# Patient Record
Sex: Female | Born: 1980 | Race: Black or African American | Hispanic: No | Marital: Married | Smoking: Never smoker
Health system: Southern US, Community
[De-identification: ages and names within clinical notes are randomized; demographics above are authoritative.]

## PROBLEM LIST (undated history)

## (undated) DIAGNOSIS — L309 Dermatitis, unspecified: Secondary | ICD-10-CM

## (undated) DIAGNOSIS — O24419 Gestational diabetes mellitus in pregnancy, unspecified control: Secondary | ICD-10-CM

## (undated) HISTORY — DX: Dermatitis, unspecified: L30.9

## (undated) HISTORY — DX: Gestational diabetes mellitus in pregnancy, unspecified control: O24.419

## (undated) HISTORY — PX: NO PAST SURGERIES: SHX2092

---

## 2016-04-08 ENCOUNTER — Ambulatory Visit
Admission: RE | Admit: 2016-04-08 | Discharge: 2016-04-08 | Disposition: A | Discharge status: Home / Self Care | Payer: BLUE CROSS/BLUE SHIELD | Source: Ambulatory Visit | Attending: Obstetrics and Gynecology | Admitting: Obstetrics and Gynecology

## 2016-04-08 ENCOUNTER — Ambulatory Visit
Admission: RE | Admit: 2016-04-08 | Discharge: 2016-04-08 | Disposition: A | Discharge status: Home / Self Care | Payer: BLUE CROSS/BLUE SHIELD | Source: Ambulatory Visit | Attending: Pediatrics | Admitting: Pediatrics

## 2016-04-08 NOTE — Lactation Note (Signed)
Courtney Consultation Note  Patient Name: Courtney Courtney Today's Date: 04/08/2016     Maternal Data  Mom has been pumping every 2-3hrs since d/c on 8/22.  Feeding  "Courtney Courtney" has not been able to go to the breast for a feeding due to a tongue tie. Baby had frenectomy done 24hrs ago on 8/23 by pediatrician and is f/u with LC to ensure transferring of milk.  LATCH Score/Interventions  Courtney Harmon was able to feed with the help of a 53mm nipple shield in the football position where he was able to transfer 36mL in 59mins, taking breaks for weigh ins to ensure he was adequately doing the transfer. Since he has been use to volume, LC put mom's expressed milk into a nipple shield with a curved tip syringe where he was able to stay on mom's breast for remainder of the feeding.                     Courtney Tools Discussed/Used  Mom is to use nipple shield with syringe, do Courtney Courtney's Post-Frenectomy stretches, and f/u with 10-41mL of slow flow nipple bottle when mom doesn't hear enough swallows or feels like her breast has been adequately drained.    Consult Status  Mom is to f/u via support group or phone to LC's.     Courtney Courtney 04/08/2016, 4:21 PM

## 2016-04-08 NOTE — Discharge Instructions (Signed)
See [facility] Consult note

## 2017-10-19 DIAGNOSIS — R6889 Other general symptoms and signs: Secondary | ICD-10-CM | POA: Diagnosis not present

## 2017-11-15 DIAGNOSIS — J339 Nasal polyp, unspecified: Secondary | ICD-10-CM | POA: Diagnosis not present

## 2017-12-19 DIAGNOSIS — Z3002 Counseling and instruction in natural family planning to avoid pregnancy: Secondary | ICD-10-CM | POA: Diagnosis not present

## 2018-04-11 DIAGNOSIS — Z3201 Encounter for pregnancy test, result positive: Secondary | ICD-10-CM | POA: Diagnosis not present

## 2018-04-11 DIAGNOSIS — Z3687 Encounter for antenatal screening for uncertain dates: Secondary | ICD-10-CM | POA: Diagnosis not present

## 2018-04-13 DIAGNOSIS — Z6830 Body mass index (BMI) 30.0-30.9, adult: Secondary | ICD-10-CM | POA: Diagnosis not present

## 2018-04-13 DIAGNOSIS — Z3201 Encounter for pregnancy test, result positive: Secondary | ICD-10-CM | POA: Diagnosis not present

## 2018-04-27 DIAGNOSIS — O09521 Supervision of elderly multigravida, first trimester: Secondary | ICD-10-CM | POA: Diagnosis not present

## 2018-04-27 DIAGNOSIS — Z3A09 9 weeks gestation of pregnancy: Secondary | ICD-10-CM | POA: Diagnosis not present

## 2018-04-27 DIAGNOSIS — Z3689 Encounter for other specified antenatal screening: Secondary | ICD-10-CM | POA: Diagnosis not present

## 2018-05-09 DIAGNOSIS — Z118 Encounter for screening for other infectious and parasitic diseases: Secondary | ICD-10-CM | POA: Diagnosis not present

## 2018-05-09 DIAGNOSIS — O09521 Supervision of elderly multigravida, first trimester: Secondary | ICD-10-CM | POA: Diagnosis not present

## 2018-05-09 DIAGNOSIS — Z23 Encounter for immunization: Secondary | ICD-10-CM | POA: Diagnosis not present

## 2018-05-09 DIAGNOSIS — Z3481 Encounter for supervision of other normal pregnancy, first trimester: Secondary | ICD-10-CM | POA: Diagnosis not present

## 2018-05-09 DIAGNOSIS — Z3A10 10 weeks gestation of pregnancy: Secondary | ICD-10-CM | POA: Diagnosis not present

## 2018-05-30 DIAGNOSIS — O09521 Supervision of elderly multigravida, first trimester: Secondary | ICD-10-CM | POA: Diagnosis not present

## 2018-05-30 DIAGNOSIS — Z3481 Encounter for supervision of other normal pregnancy, first trimester: Secondary | ICD-10-CM | POA: Diagnosis not present

## 2018-05-30 DIAGNOSIS — Z3A13 13 weeks gestation of pregnancy: Secondary | ICD-10-CM | POA: Diagnosis not present

## 2018-07-04 DIAGNOSIS — Z3A18 18 weeks gestation of pregnancy: Secondary | ICD-10-CM | POA: Diagnosis not present

## 2018-07-04 DIAGNOSIS — Z3482 Encounter for supervision of other normal pregnancy, second trimester: Secondary | ICD-10-CM | POA: Diagnosis not present

## 2018-07-18 DIAGNOSIS — O09522 Supervision of elderly multigravida, second trimester: Secondary | ICD-10-CM | POA: Diagnosis not present

## 2018-07-18 DIAGNOSIS — Z3A20 20 weeks gestation of pregnancy: Secondary | ICD-10-CM | POA: Diagnosis not present

## 2018-07-26 DIAGNOSIS — R102 Pelvic and perineal pain: Secondary | ICD-10-CM | POA: Diagnosis not present

## 2018-07-28 DIAGNOSIS — F4323 Adjustment disorder with mixed anxiety and depressed mood: Secondary | ICD-10-CM | POA: Diagnosis not present

## 2018-08-11 DIAGNOSIS — H5213 Myopia, bilateral: Secondary | ICD-10-CM | POA: Diagnosis not present

## 2018-08-11 DIAGNOSIS — H52222 Regular astigmatism, left eye: Secondary | ICD-10-CM | POA: Diagnosis not present

## 2018-08-30 DIAGNOSIS — Z362 Encounter for other antenatal screening follow-up: Secondary | ICD-10-CM | POA: Diagnosis not present

## 2018-09-01 DIAGNOSIS — F4323 Adjustment disorder with mixed anxiety and depressed mood: Secondary | ICD-10-CM | POA: Diagnosis not present

## 2018-09-18 DIAGNOSIS — Z3A28 28 weeks gestation of pregnancy: Secondary | ICD-10-CM | POA: Diagnosis not present

## 2018-09-18 DIAGNOSIS — Z3689 Encounter for other specified antenatal screening: Secondary | ICD-10-CM | POA: Diagnosis not present

## 2018-10-20 DIAGNOSIS — O09893 Supervision of other high risk pregnancies, third trimester: Secondary | ICD-10-CM | POA: Diagnosis not present

## 2018-10-20 DIAGNOSIS — Z3A34 34 weeks gestation of pregnancy: Secondary | ICD-10-CM | POA: Diagnosis not present

## 2018-10-23 NOTE — Progress Notes (Signed)
Patient referred by Punger, Barron Schmid, * for dyspnea on exertion, palpitations.   Subjective:   Courtney Harmon, female    DOB: 04/02/1981, 38 y.o.   MRN: [id number]   Chief Complaint  Patient presents with  . Shortness of Breath    on exertion     HPI  38 y.o.  female with dyspnea on exertion, palpitations.   Patient is currently [redacted] weeks pregnant with her second child.  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  2-3 weeks ago, she experienced palpitations while climbing up stairs holding her older child.  She also would notice "gurgling" sensation behind her chest.  All the symptoms have now improved and nearly resolved.  Patient had gestational diabetes with her first pregnancy which was controlled with diet.  She does not have gestational diabetes or any other comorbidities during her second pregnancy.   Past Medical History:  Diagnosis Date  . Gestational diabetes    With first pregnancy     History reviewed. No pertinent surgical history.   Social History   Socioeconomic History  . Marital status: Married    Spouse name: Not on file  . Number of children: 1  . Years of education: Not on file  . Highest education level: Not on file  Occupational History  . Not on file  Social Needs  . Financial resource strain: Not on file  . Food insecurity:    Worry: Not on file    Inability: Not on file  . Transportation needs:    Medical: Not on file    Non-medical: Not on file  Tobacco Use  . Smoking status: Never Smoker  . Smokeless tobacco: Never Used  Substance and Sexual Activity  . Alcohol use: Not Currently  . Drug use: Never  . Sexual activity: Not on file  Lifestyle  . Physical activity:    Days per week: Not on file    Minutes per session: Not on file  . Stress: Not on file  Relationships  . Social connections:    Talks on phone: Not on file    Gets together: Not on file    Attends religious service: Not on  file    Active member of club or organization: Not on file    Attends meetings of clubs or organizations: Not on file    Relationship status: Not on file  . Intimate partner violence:    Fear of current or ex partner: Not on file    Emotionally abused: Not on file    Physically abused: Not on file    Forced sexual activity: Not on file  Other Topics Concern  . Not on file  Social History Narrative  . Not on file     Current Outpatient Medications on File Prior to Visit  Medication Sig Dispense Refill  . ferrous sulfate 325 (65 FE) MG tablet Take by mouth daily. Unsure of dosage    . magnesium 30 MG tablet Take 400 mg by mouth daily.    . Omega-3 Fatty Acids (FISH OIL) 1000 MG CAPS Take by mouth daily. Unsure of dosage    . Prenatal Vit-Fe Fumarate-FA (PRENATAL MULTIVITAMIN) TABS tablet Take 1 tablet by mouth daily at 12 noon.     No current facility-administered medications on file prior to visit.     Cardiovascular studies:  EKG 10/25/2018: Sinus rhythm 94 bpm.  Normal EKG.  Recent labs: 09/18/2018: H/H 10/32. MCV 92. Platelets 244  Review of Systems  Constitution: Negative for decreased appetite, malaise/fatigue, weight gain and weight loss.  HENT: Negative for congestion.   Eyes: Negative for visual disturbance.  Cardiovascular: Positive for dyspnea on exertion (Now resolved) and palpitations (Now resolved). Negative for chest pain, leg swelling and syncope.  Respiratory: Negative for shortness of breath.   Endocrine: Negative for cold intolerance.  Hematologic/Lymphatic: Does not bruise/bleed easily.  Skin: Negative for itching and rash.  Musculoskeletal: Negative for myalgias.  Gastrointestinal: Negative for abdominal pain, nausea and vomiting.  Genitourinary: Negative for dysuria.  Neurological: Negative for dizziness and weakness.  Psychiatric/Behavioral: The patient is not nervous/anxious.   All other systems reviewed and are negative.         Vitals:   10/25/18 1307  BP: 125/71  Pulse: 87  SpO2: 91%    Objective:   Physical Exam  Constitutional: She appears well-developed and well-nourished. No distress.  HENT:  Head: Atraumatic.  Eyes: Conjunctivae are normal.  Neck: Neck supple. No JVD present. No thyromegaly present.  Cardiovascular: Normal rate, regular rhythm and intact distal pulses. Exam reveals no gallop.  Murmur (I/VI ESM at RUSB) heard. Pulmonary/Chest: Effort normal and breath sounds normal.  Abdominal: Soft. Bowel sounds are normal.  Increased abdominal girth due to pregnancy  Musculoskeletal: Normal range of motion.        General: No edema.  Neurological: She is alert.  Skin: Skin is warm and dry.  Psychiatric: She has a normal mood and affect.          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  1. SOB (shortness of breath) & palpitations: Patient's symptoms are nearly resolved at this time.  I have reassured her that the symptoms were likely benign in the setting of reassuring physical exam and EKG.  No further testing necessary at this time.  Encouraged adequate hydration.  I will see her on as-needed basis.   Thank you for referring the patient to [place]. Please feel free to contact with any questions.  Nigel Mormon, MD [facility] [place] Pager: [phone] Office: [phone] If no answer Cell [phone]

## 2018-10-25 ENCOUNTER — Ambulatory Visit: Payer: BLUE CROSS/BLUE SHIELD | Admitting: Cardiology

## 2018-10-25 ENCOUNTER — Other Ambulatory Visit: Payer: Self-pay

## 2018-10-25 ENCOUNTER — Encounter: Payer: Self-pay | Admitting: Cardiology

## 2018-10-25 VITALS — BP 125/71 | HR 87 | Ht 63.0 in | Wt 189.2 lb

## 2018-10-25 DIAGNOSIS — R002 Palpitations: Secondary | ICD-10-CM | POA: Diagnosis not present

## 2018-10-25 DIAGNOSIS — R0602 Shortness of breath: Secondary | ICD-10-CM | POA: Diagnosis not present

## 2018-10-25 DIAGNOSIS — Z23 Encounter for immunization: Secondary | ICD-10-CM | POA: Diagnosis not present

## 2018-11-09 DIAGNOSIS — Q270 Congenital absence and hypoplasia of umbilical artery: Secondary | ICD-10-CM | POA: Diagnosis not present

## 2018-11-09 DIAGNOSIS — Z3A36 36 weeks gestation of pregnancy: Secondary | ICD-10-CM | POA: Diagnosis not present

## 2018-11-09 DIAGNOSIS — Z3685 Encounter for antenatal screening for Streptococcus B: Secondary | ICD-10-CM | POA: Diagnosis not present

## 2018-12-05 DIAGNOSIS — R030 Elevated blood-pressure reading, without diagnosis of hypertension: Secondary | ICD-10-CM | POA: Diagnosis not present

## 2018-12-05 DIAGNOSIS — Q270 Congenital absence and hypoplasia of umbilical artery: Secondary | ICD-10-CM | POA: Diagnosis not present

## 2018-12-05 DIAGNOSIS — Z3483 Encounter for supervision of other normal pregnancy, third trimester: Secondary | ICD-10-CM | POA: Diagnosis not present

## 2018-12-05 DIAGNOSIS — O99824 Streptococcus B carrier state complicating childbirth: Secondary | ICD-10-CM | POA: Diagnosis not present

## 2018-12-05 DIAGNOSIS — Z3A40 40 weeks gestation of pregnancy: Secondary | ICD-10-CM | POA: Diagnosis not present

## 2018-12-09 ENCOUNTER — Other Ambulatory Visit: Payer: Self-pay

## 2018-12-09 ENCOUNTER — Inpatient Hospital Stay (HOSPITAL_COMMUNITY)
Admission: AD | Admit: 2018-12-09 | Discharge: 2018-12-12 | DRG: 776 | Disposition: A | Discharge status: Home / Self Care | Payer: BLUE CROSS/BLUE SHIELD | Attending: Obstetrics and Gynecology | Admitting: Obstetrics and Gynecology

## 2018-12-09 ENCOUNTER — Encounter (HOSPITAL_COMMUNITY): Payer: Self-pay | Admitting: *Deleted

## 2018-12-09 DIAGNOSIS — N719 Inflammatory disease of uterus, unspecified: Secondary | ICD-10-CM | POA: Diagnosis present

## 2018-12-09 DIAGNOSIS — O1415 Severe pre-eclampsia, complicating the puerperium: Principal | ICD-10-CM | POA: Diagnosis present

## 2018-12-09 DIAGNOSIS — O1410 Severe pre-eclampsia, unspecified trimester: Secondary | ICD-10-CM | POA: Diagnosis present

## 2018-12-09 DIAGNOSIS — R509 Fever, unspecified: Secondary | ICD-10-CM

## 2018-12-09 DIAGNOSIS — O8612 Endometritis following delivery: Secondary | ICD-10-CM | POA: Diagnosis not present

## 2018-12-09 DIAGNOSIS — O1495 Unspecified pre-eclampsia, complicating the puerperium: Secondary | ICD-10-CM | POA: Diagnosis not present

## 2018-12-09 LAB — CBC WITH DIFFERENTIAL/PLATELET
Abs Immature Granulocytes: 0.08 10*3/uL — ABNORMAL HIGH (ref 0.00–0.07)
Basophils Absolute: 0 10*3/uL (ref 0.0–0.1)
Basophils Relative: 0 %
Eosinophils Absolute: 0.1 10*3/uL (ref 0.0–0.5)
Eosinophils Relative: 1 %
HCT: 33.6 % — ABNORMAL LOW (ref 36.0–46.0)
Hemoglobin: 10.8 g/dL — ABNORMAL LOW (ref 12.0–15.0)
Immature Granulocytes: 1 %
Lymphocytes Relative: 6 %
Lymphs Abs: 0.9 10*3/uL (ref 0.7–4.0)
MCH: 29.6 pg (ref 26.0–34.0)
MCHC: 32.1 g/dL (ref 30.0–36.0)
MCV: 92.1 fL (ref 80.0–100.0)
Monocytes Absolute: 0.7 10*3/uL (ref 0.1–1.0)
Monocytes Relative: 5 %
Neutro Abs: 13.6 10*3/uL — ABNORMAL HIGH (ref 1.7–7.7)
Neutrophils Relative %: 87 %
Platelets: 243 10*3/uL (ref 150–400)
RBC: 3.65 MIL/uL — ABNORMAL LOW (ref 3.87–5.11)
RDW: 15.3 % (ref 11.5–15.5)
WBC: 15.4 10*3/uL — ABNORMAL HIGH (ref 4.0–10.5)
nRBC: 0 % (ref 0.0–0.2)

## 2018-12-09 LAB — URINALYSIS, ROUTINE W REFLEX MICROSCOPIC
Bacteria, UA: NONE SEEN
Bilirubin Urine: NEGATIVE
Glucose, UA: NEGATIVE mg/dL
Ketones, ur: NEGATIVE mg/dL
Leukocytes,Ua: NEGATIVE
Nitrite: NEGATIVE
Protein, ur: NEGATIVE mg/dL
Specific Gravity, Urine: 1.011 (ref 1.005–1.030)
pH: 6 (ref 5.0–8.0)

## 2018-12-09 LAB — PROTEIN / CREATININE RATIO, URINE
Creatinine, Urine: 34.15 mg/dL
Total Protein, Urine: <6 mg/dL

## 2018-12-09 LAB — COMPREHENSIVE METABOLIC PANEL
ALT: 100 U/L — ABNORMAL HIGH (ref 0–44)
AST: 51 U/L — ABNORMAL HIGH (ref 15–41)
Albumin: 2.9 g/dL — ABNORMAL LOW (ref 3.5–5.0)
Alkaline Phosphatase: 113 U/L (ref 38–126)
Anion gap: 11 (ref 5–15)
BUN: 9 mg/dL (ref 6–20)
CO2: 24 mmol/L (ref 22–32)
Calcium: 9.2 mg/dL (ref 8.9–10.3)
Chloride: 103 mmol/L (ref 98–111)
Creatinine, Ser: 0.6 mg/dL (ref 0.44–1.00)
GFR calc Af Amer: >60 mL/min (ref >60)
GFR calc non Af Amer: >60 mL/min (ref >60)
Glucose, Bld: 88 mg/dL (ref 70–99)
Potassium: 3.4 mmol/L — ABNORMAL LOW (ref 3.5–5.1)
Sodium: 138 mmol/L (ref 135–145)
Total Bilirubin: 0.9 mg/dL (ref 0.3–1.2)
Total Protein: 6.1 g/dL — ABNORMAL LOW (ref 6.5–8.1)

## 2018-12-09 LAB — TYPE AND SCREEN
ABO/RH(D): O POS
Antibody Screen: NEGATIVE

## 2018-12-09 LAB — ABO/RH: ABO/RH(D): O POS

## 2018-12-09 MED ORDER — ACETAMINOPHEN 500 MG PO TABS
1000.0000 mg | ORAL_TABLET | Freq: Once | ORAL | Status: AC
  Administered 2018-12-09: 1000 mg via ORAL
  Filled 2018-12-09: qty 2

## 2018-12-09 MED ORDER — CALCIUM CARBONATE ANTACID 500 MG PO CHEW: 2.0000 | CHEWABLE_TABLET | ORAL | Status: DC | PRN

## 2018-12-09 MED ORDER — NIFEDIPINE 10 MG PO CAPS
10.0000 mg | ORAL_CAPSULE | ORAL | Status: DC | PRN
  Administered 2018-12-09: 19:00:00 10 mg via ORAL
  Filled 2018-12-09: qty 1

## 2018-12-09 MED ORDER — CLINDAMYCIN PHOSPHATE 900 MG/50ML IV SOLN
900.0000 mg | Freq: Three times a day (TID) | INTRAVENOUS | Status: DC
  Administered 2018-12-09 – 2018-12-12 (×8): 900 mg via INTRAVENOUS
  Filled 2018-12-09 (×8): qty 50

## 2018-12-09 MED ORDER — ZOLPIDEM TARTRATE 5 MG PO TABS: 5.0000 mg | ORAL_TABLET | Freq: Every evening | ORAL | Status: DC | PRN

## 2018-12-09 MED ORDER — NIFEDIPINE 10 MG PO CAPS: 20.0000 mg | ORAL_CAPSULE | ORAL | Status: DC | PRN

## 2018-12-09 MED ORDER — GENTAMICIN SULFATE 40 MG/ML IJ SOLN
5.0000 mg/kg | INTRAVENOUS | Status: DC
  Administered 2018-12-09 – 2018-12-11 (×3): 330 mg via INTRAVENOUS
  Filled 2018-12-09 (×4): qty 8.25

## 2018-12-09 MED ORDER — LACTATED RINGERS IV SOLN
INTRAVENOUS | Status: DC
  Administered 2018-12-09 – 2018-12-10 (×4): via INTRAVENOUS

## 2018-12-09 MED ORDER — LABETALOL HCL 5 MG/ML IV SOLN: 40.0000 mg | INTRAVENOUS | Status: DC | PRN

## 2018-12-09 MED ORDER — PRENATAL MULTIVITAMIN CH
1.0000 | ORAL_TABLET | Freq: Every day | ORAL | Status: DC
  Administered 2018-12-10 – 2018-12-11 (×2): 1 via ORAL
  Filled 2018-12-09 (×2): qty 1

## 2018-12-09 MED ORDER — NIFEDIPINE 10 MG PO CAPS
20.0000 mg | ORAL_CAPSULE | ORAL | Status: DC | PRN
  Administered 2018-12-09: 20:00:00 20 mg via ORAL

## 2018-12-09 MED ORDER — IBUPROFEN 600 MG PO TABS: 600.0000 mg | ORAL_TABLET | Freq: Four times a day (QID) | ORAL | Status: DC | PRN

## 2018-12-09 MED ORDER — DOCUSATE SODIUM 100 MG PO CAPS
100.0000 mg | ORAL_CAPSULE | Freq: Every day | ORAL | Status: DC
  Administered 2018-12-10 – 2018-12-11 (×2): 100 mg via ORAL
  Filled 2018-12-09 (×3): qty 1

## 2018-12-09 MED ORDER — ACETAMINOPHEN 325 MG PO TABS: 650.0000 mg | ORAL_TABLET | ORAL | Status: DC | PRN

## 2018-12-09 MED ORDER — NIFEDIPINE 10 MG PO CAPS
20.0000 mg | ORAL_CAPSULE | ORAL | Status: DC | PRN
  Filled 2018-12-09: qty 2

## 2018-12-09 MED ORDER — ACETAMINOPHEN 500 MG PO TABS
1000.0000 mg | ORAL_TABLET | Freq: Four times a day (QID) | ORAL | Status: DC | PRN
  Administered 2018-12-10 (×4): 1000 mg via ORAL
  Filled 2018-12-09 (×4): qty 2

## 2018-12-09 MED ORDER — MAGNESIUM SULFATE BOLUS VIA INFUSION
4.0000 g | Freq: Once | INTRAVENOUS | Status: AC
  Administered 2018-12-09: 4 g via INTRAVENOUS
  Filled 2018-12-09: qty 500

## 2018-12-09 MED ORDER — MAGNESIUM SULFATE 40 G IN LACTATED RINGERS - SIMPLE
2.0000 g/h | INTRAVENOUS | Status: AC
  Administered 2018-12-09 – 2018-12-10 (×2): 2 g/h via INTRAVENOUS
  Filled 2018-12-09 (×2): qty 500

## 2018-12-09 MED ORDER — NIFEDIPINE 10 MG PO CAPS: 10.0000 mg | ORAL_CAPSULE | ORAL | Status: DC | PRN

## 2018-12-09 NOTE — MAU Provider Note (Signed)
History     HISTORY AND PHYSICAL EXAM  CSN: [id number]    Chief Complaint  Patient presents with  . Abdominal Pain  . Chills   Abdominal Pain  Associated symptoms include a fever. Pertinent negatives include no headaches.      38 y.o. female here with abdominal pain and chills; both symptoms started on Thursday. She is status post uncomplicated SVD on 4/1 at [facility]. Prenatal care complicated by SUA with nl growth sonos. Says today she started having all over body chills. When she is nursing her infant she feels intense all over abdominal pan. She has also noted an increase in vaginal bleeding with clots since yesterday. No foul odor. Says she has changed her pad today 4-5 X today. She has been taking motrin for the symptoms every 8 hours which has been helpful. She had elevated BP readings after delivery and had a visit in the office 24 hours after her delivery with nl BP and nl labs. She denies HA or scotoma. Denies CP , SOB , HA or epigastric pain  No breast pain,  no urinary complaints.   OB History    Gravida  2   Para  2   Term  2   Preterm      AB      Living  2     SAB      TAB      Ectopic      Multiple      Live Births  2           Past Medical History:  Diagnosis Date  . Gestational diabetes    With first pregnancy    Past Surgical History:  Procedure Laterality Date  . NO PAST SURGERIES      Family History  Problem Relation Age of Onset  . Hyperlipidemia Mother     Social History   Tobacco Use  . Smoking status: Never Smoker  . Smokeless tobacco: Never Used  Substance Use Topics  . Alcohol use: Not Currently  . Drug use: Never    Allergies: No Known Allergies  Medications Prior to Admission  Medication Sig Dispense Refill Last Dose  . ferrous sulfate 325 (65 FE) MG tablet Take by mouth daily. Unsure of dosage   Taking  . magnesium 30 MG tablet Take 400 mg by mouth daily.   Taking  . Omega-3 Fatty Acids (FISH  OIL) 1000 MG CAPS Take by mouth daily. Unsure of dosage   Taking  . Prenatal Vit-Fe Fumarate-FA (PRENATAL MULTIVITAMIN) TABS tablet Take 1 tablet by mouth daily at 12 noon.   Taking   Results for orders placed or performed during the hospital encounter of 12/09/18 (from the past 48 hour(s))  Protein / creatinine ratio, urine     Status: None   Collection Time: 12/09/18  6:01 PM  Result Value Ref Range   Creatinine, Urine 34.15 mg/dL   Total Protein, Urine <6 mg/dL   Protein Creatinine Ratio        0.00 - 0.15 mg/mg[Cre]    Comment: RESULT BELOW REPORTABLE RANGE, UNABLE TO CALCULATE. Performed at [facility], [place] [address], [place], [place] [address]   Urinalysis, Routine w reflex microscopic     Status: Abnormal   Collection Time: 12/09/18  6:01 PM  Result Value Ref Range   Color, Urine STRAW (A) YELLOW   APPearance CLEAR CLEAR   Specific Gravity, Urine 1.011 1.005 - 1.030   pH 6.0  5.0 - 8.0   Glucose, UA NEGATIVE NEGATIVE mg/dL   Hgb urine dipstick SMALL (A) NEGATIVE   Bilirubin Urine NEGATIVE NEGATIVE   Ketones, ur NEGATIVE NEGATIVE mg/dL   Protein, ur NEGATIVE NEGATIVE mg/dL   Nitrite NEGATIVE NEGATIVE   Leukocytes,Ua NEGATIVE NEGATIVE   RBC / HPF 0-5 0 - 5 RBC/hpf   WBC, UA 0-5 0 - 5 WBC/hpf   Bacteria, UA NONE SEEN NONE SEEN   Mucus PRESENT     Comment: Performed at [facility] [address] [place], [place] [address]  CBC with Differential/Platelet     Status: Abnormal   Collection Time: 12/09/18  6:10 PM  Result Value Ref Range   WBC 15.4 (H) 4.0 - 10.5 K/uL   RBC 3.65 (L) 3.87 - 5.11 MIL/uL   Hemoglobin 10.8 (L) 12.0 - 15.0 g/dL   HCT 33.6 (L) 36.0 - 46.0 %   MCV 92.1 80.0 - 100.0 fL   MCH 29.6 26.0 - 34.0 pg   MCHC 32.1 30.0 - 36.0 g/dL   RDW 15.3 11.5 - 15.5 %   Platelets 243 150 - 400 K/uL   nRBC 0.0 0.0 - 0.2 %   Neutrophils Relative % 87 %   Neutro Abs 13.6 (H) 1.7 - 7.7 K/uL   Lymphocytes Relative 6 %   Lymphs Abs 0.9 0.7 - 4.0 K/uL    Monocytes Relative 5 %   Monocytes Absolute 0.7 0.1 - 1.0 K/uL   Eosinophils Relative 1 %   Eosinophils Absolute 0.1 0.0 - 0.5 K/uL   Basophils Relative 0 %   Basophils Absolute 0.0 0.0 - 0.1 K/uL   Immature Granulocytes 1 %   Abs Immature Granulocytes 0.08 (H) 0.00 - 0.07 K/uL    Comment: Performed at [facility] [address] [place], [place] [address]  Comprehensive metabolic panel     Status: Abnormal   Collection Time: 12/09/18  6:10 PM  Result Value Ref Range   Sodium 138 135 - 145 mmol/L   Potassium 3.4 (L) 3.5 - 5.1 mmol/L   Chloride 103 98 - 111 mmol/L   CO2 24 22 - 32 mmol/L   Glucose, Bld 88 70 - 99 mg/dL   BUN 9 6 - 20 mg/dL   Creatinine, Ser 0.60 0.44 - 1.00 mg/dL   Calcium 9.2 8.9 - 10.3 mg/dL   Total Protein 6.1 (L) 6.5 - 8.1 g/dL   Albumin 2.9 (L) 3.5 - 5.0 g/dL   AST 51 (H) 15 - 41 U/L   ALT 100 (H) 0 - 44 U/L   Alkaline Phosphatase 113 38 - 126 U/L   Total Bilirubin 0.9 0.3 - 1.2 mg/dL   GFR calc non Af Amer >60 >60 mL/min   GFR calc Af Amer >60 >60 mL/min   Anion gap 11 5 - 15    Comment: Performed at [facility] [address], [place], [place] [address]  Type and screen [place]     Status: None   Collection Time: 12/09/18  7:51 PM  Result Value Ref Range   ABO/RH(D) O POS    Antibody Screen NEG    Sample Expiration      12/12/2018 Performed at [facility], [place] [address], [place], [place] [address]   ABO/Rh     Status: None (Preliminary result)   Collection Time: 12/09/18  7:51 PM  Result Value Ref Range   ABO/RH(D)      Jenetta Downer  POS Performed at [facility], [place] [address], [place], [place] [address]    Review of Systems  Constitutional: Positive for chills and fever.  HENT: Negative for congestion and sore throat.   Eyes: Negative for photophobia and visual disturbance.  Respiratory: Negative for shortness of breath.   Cardiovascular: Negative.   Gastrointestinal: Positive for abdominal pain.   Genitourinary: Positive for vaginal bleeding.  Neurological: Negative.  Negative for headaches.   Physical Exam   Blood pressure 114/64, pulse (!) 120, temperature 99 F (37.2 C), temperature source Oral, resp. rate 17, height 5\' 3"  (1.6 m), weight 84.2 kg, SpO2 96 %.   Patient Vitals for the past 24 hrs:  BP Temp Temp src Pulse Resp SpO2 Height Weight  12/09/18 2200 114/64 99 F (37.2 C) Oral (!) 120 17 96 % - -  12/09/18 2110 95/65 - - (!) 117 (P) 20 96 % - -  12/09/18 2040 111/60 - - (!) 124 (!) (P) 21 - - -  12/09/18 2020 - - - - - 96 % - -  12/09/18 2011 (!) 103/51 (!) 101.3 F (38.5 C) Oral (!) 138 18 97 % - -  12/09/18 1916 (!) 164/72 - - (!) 132 - - - -  12/09/18 1846 (!) 163/86 - - (!) 123 - - - -  12/09/18 1831 (!) 168/77 - - (!) 125 - - - -  12/09/18 1818 (!) 157/87 - - (!) 120 - - - -  12/09/18 1815 (!) 157/87 (!) 101.2 F (38.4 C) - - 18 - - -  12/09/18 1710 (!) 150/90 - - (!) 110 - - - -  12/09/18 1656 (!) 149/81 99.8 F (37.7 C) Oral (!) 109 20 98 % - -  12/09/18 1650 - - - - - - 5\' 3"  (1.6 m) 84.2 kg    PHYSICAL: WDWN AAF NAD NECK ; SUPPLE LGS: CTA CV: TACHY, NO MRG ABD: SOFT, FUNDUS TENDER NO CVAT EXT: NO CORDS NEURO: NON FOCAL SKIN: INTACT PELVIC: MINIMAL BLEEDING  MAU Course  Procedures  None     Assessment and Plan   A:  PP PEC PP endometritis  P:  Admit to high risk OB Gentamycin per pharmacy Clindamycin 900 mg IV Q8 hours Magnesium sulfate prophylaxis Anti HTN prn I/Os Tylenol Q6 hours for fever  Brien Few, MD 12/09/2018 10:41 PM

## 2018-12-09 NOTE — MAU Note (Signed)
Courtney Harmon is a 38 y.o. here in MAU reporting: SVD on 4/21 at [facility]. Started having chills and shaking today, states when she breast feeds her whole abdomen hurts and that pain started on Thursday. Has not checked her temperature at home. States PP bleeding last night was heavy with clots, pt reports that has continued into today, has changed her pad 4-5 times today. Reports no foul odor. Has been taking motrin every 8 hours since she got discharge  Onset of complaint: today  Pain score: 8/10  Vitals:   12/09/18 1656  BP: (!) 149/81  Pulse: (!) 109  Resp: 20  Temp: 99.8 F (37.7 C)  SpO2: 98%   States her BP was a little bit elevated when being discharged from birth center    Lab orders placed from triage: none

## 2018-12-09 NOTE — Progress Notes (Signed)
ANTIBIOTIC CONSULT NOTE - INITIAL  Pharmacy Consult for Gentamicin Indication: postpartum endometritis  No Known Allergies  Patient Measurements: Height: 5\' 3"  (160 cm) Weight: 185 lb 11.2 oz (84.2 kg) IBW/kg (Calculated) : 52.4 Adjusted Body Weight: 65.1  Vital Signs: Temp: 101.2 F (38.4 C) (04/25 1815) Temp Source: Oral (04/25 1656) BP: 164/72 (04/25 1916) Pulse Rate: 132 (04/25 1916)  Labs: Recent Labs    12/09/18 1810  WBC 15.4*  HGB 10.8*  PLT 243  CREATININE 0.60   No results for input(s): GENTTROUGH, GENTPEAK, GENTRANDOM in the last 72 hours.   Microbiology: No results found for this or any previous visit (from the past 720 hour(s)).  Medications:  Clindamycin 900mg  IV q8h  Assessment: 38 y.o. female G2P2002 with SVD on 4/21 reports abdominal pain with breastfeeding and passing large clots, per MD note.  Goal of Therapy:  Gentamicin peak 6-8 mg/L and Trough < 1 mg/L  Plan:  Gentamicin 330 mg IV every 24 hrs  Check Scr with next labs if gentamicin continued. Will check gentamicin levels if continued > 72hr or clinically indicated.  Jordan Hawks Deena Shaub 12/09/2018,7:47 PM

## 2018-12-09 NOTE — MAU Provider Note (Addendum)
History     CSN: [id number]  Arrival date and time: 12/09/18 1643   None     Chief Complaint  Patient presents with  . Abdominal Pain  . Chills   HPI   Ms.Courtney Harmon is a 38 y.o. female here with abdominal pain and chills; both symptoms started on Thursday. She is status post SVD on 4/1 at [facility]. She spoke to the Midwife Orma Render today who recommended she be seen today in MAU. Says today she started having all over body chills. She did not check her temp at home. When she is nursing her infant she feels intense all over abdominal pan. She has also noted an increase in vaginal bleeding with clots since yesterday. No foul odor. Says she has changed her pad today 4-5 X today. She has been taking motrin for the symptoms every 8 hours which has been helpful. She had elevated BP readings after delivery and had a visit in the office 24 hours after her delivery and her BP was "normal". She denies HA or scotoma. Says she felt great after her delivery.   No breast pain, no SOB, no urinary complaints.   OB History    Gravida  2   Para  2   Term  2   Preterm      AB      Living  2     SAB      TAB      Ectopic      Multiple      Live Births  2           Past Medical History:  Diagnosis Date  . Gestational diabetes    With first pregnancy    Past Surgical History:  Procedure Laterality Date  . NO PAST SURGERIES      Family History  Problem Relation Age of Onset  . Hyperlipidemia Mother     Social History   Tobacco Use  . Smoking status: Never Smoker  . Smokeless tobacco: Never Used  Substance Use Topics  . Alcohol use: Not Currently  . Drug use: Never    Allergies: No Known Allergies  Medications Prior to Admission  Medication Sig Dispense Refill Last Dose  . ferrous sulfate 325 (65 FE) MG tablet Take by mouth daily. Unsure of dosage   Taking  . magnesium 30 MG tablet Take 400 mg by mouth daily.   Taking  . Omega-3 Fatty  Acids (FISH OIL) 1000 MG CAPS Take by mouth daily. Unsure of dosage   Taking  . Prenatal Vit-Fe Fumarate-FA (PRENATAL MULTIVITAMIN) TABS tablet Take 1 tablet by mouth daily at 12 noon.   Taking   Results for orders placed or performed during the hospital encounter of 12/09/18 (from the past 48 hour(s))  Urinalysis, Routine w reflex microscopic     Status: Abnormal   Collection Time: 12/09/18  6:01 PM  Result Value Ref Range   Color, Urine STRAW (A) YELLOW   APPearance CLEAR CLEAR   Specific Gravity, Urine 1.011 1.005 - 1.030   pH 6.0 5.0 - 8.0   Glucose, UA NEGATIVE NEGATIVE mg/dL   Hgb urine dipstick SMALL (A) NEGATIVE   Bilirubin Urine NEGATIVE NEGATIVE   Ketones, ur NEGATIVE NEGATIVE mg/dL   Protein, ur NEGATIVE NEGATIVE mg/dL   Nitrite NEGATIVE NEGATIVE   Leukocytes,Ua NEGATIVE NEGATIVE   RBC / HPF 0-5 0 - 5 RBC/hpf   WBC, UA 0-5 0 - 5 WBC/hpf   Bacteria, UA  NONE SEEN NONE SEEN   Mucus PRESENT     Comment: Performed at [facility], [place] [address] [place], [place] [address]  CBC with Differential/Platelet     Status: Abnormal   Collection Time: 12/09/18  6:10 PM  Result Value Ref Range   WBC 15.4 (H) 4.0 - 10.5 K/uL   RBC 3.65 (L) 3.87 - 5.11 MIL/uL   Hemoglobin 10.8 (L) 12.0 - 15.0 g/dL   HCT 33.6 (L) 36.0 - 46.0 %   MCV 92.1 80.0 - 100.0 fL   MCH 29.6 26.0 - 34.0 pg   MCHC 32.1 30.0 - 36.0 g/dL   RDW 15.3 11.5 - 15.5 %   Platelets 243 150 - 400 K/uL   nRBC 0.0 0.0 - 0.2 %   Neutrophils Relative % 87 %   Neutro Abs 13.6 (H) 1.7 - 7.7 K/uL   Lymphocytes Relative 6 %   Lymphs Abs 0.9 0.7 - 4.0 K/uL   Monocytes Relative 5 %   Monocytes Absolute 0.7 0.1 - 1.0 K/uL   Eosinophils Relative 1 %   Eosinophils Absolute 0.1 0.0 - 0.5 K/uL   Basophils Relative 0 %   Basophils Absolute 0.0 0.0 - 0.1 K/uL   Immature Granulocytes 1 %   Abs Immature Granulocytes 0.08 (H) 0.00 - 0.07 K/uL    Comment: Performed at [facility] [address] [place], [place]  [address]  Comprehensive metabolic panel     Status: Abnormal   Collection Time: 12/09/18  6:10 PM  Result Value Ref Range   Sodium 138 135 - 145 mmol/L   Potassium 3.4 (L) 3.5 - 5.1 mmol/L   Chloride 103 98 - 111 mmol/L   CO2 24 22 - 32 mmol/L   Glucose, Bld 88 70 - 99 mg/dL   BUN 9 6 - 20 mg/dL   Creatinine, Ser 0.60 0.44 - 1.00 mg/dL   Calcium 9.2 8.9 - 10.3 mg/dL   Total Protein 6.1 (L) 6.5 - 8.1 g/dL   Albumin 2.9 (L) 3.5 - 5.0 g/dL   AST 51 (H) 15 - 41 U/L   ALT 100 (H) 0 - 44 U/L   Alkaline Phosphatase 113 38 - 126 U/L   Total Bilirubin 0.9 0.3 - 1.2 mg/dL   GFR calc non Af Amer >60 >60 mL/min   GFR calc Af Amer >60 >60 mL/min   Anion gap 11 5 - 15    Comment: Performed at [facility] [address] [place], [place] [address]   Review of Systems  Constitutional: Positive for chills and fever.  HENT: Negative for congestion and sore throat.   Eyes: Negative for photophobia and visual disturbance.  Respiratory: Negative for shortness of breath.   Gastrointestinal: Positive for abdominal pain.  Genitourinary: Positive for vaginal bleeding.  Neurological: Negative for headaches.   Physical Exam   Blood pressure (!) 164/72, pulse (!) 132, temperature (!) 101.2 F (38.4 C), resp. rate 18, height 5\' 3"  (1.6 m), weight 84.2 kg, SpO2 98 %.   Patient Vitals for the past 24 hrs:  BP Temp Temp src Pulse Resp SpO2 Height Weight  12/09/18 1916 (!) 164/72 - - (!) 132 - - - -  12/09/18 1846 (!) 163/86 - - (!) 123 - - - -  12/09/18 1831 (!) 168/77 - - (!) 125 - - - -  12/09/18 1818 (!) 157/87 - - (!) 120 - - - -  12/09/18 1815 (!) 157/87 (!) 101.2 F (  38.4 C) - - 18 - - -  12/09/18 1710 (!) 150/90 - - (!) 110 - - - -  12/09/18 1656 (!) 149/81 99.8 F (37.7 C) Oral (!) 109 20 98 % - -  12/09/18 1650 - - - - - - 5\' 3"  (1.6 m) 84.2 kg    Physical Exam  Constitutional: She is oriented to person, place, and time. She appears well-developed and well-nourished.  Non-toxic  appearance. She does not have a sickly appearance. She does not appear ill. No distress.  Cardiovascular: Normal rate.  Respiratory: Effort normal and breath sounds normal. No respiratory distress. She has no wheezes. She has no rales. She exhibits no tenderness. Right breast exhibits no skin change. Left breast exhibits no skin change. Breasts are symmetrical.  GI: There is abdominal tenderness in the right lower quadrant, suprapubic area and left lower quadrant. There is no rigidity, no rebound and no guarding.  Genitourinary:    Genitourinary Comments: Bimanual exam: significant uterine pain with palpation during bimanual exam. Small-Moderate amount of dark red blood noted on exam glove. No odor noted.    Musculoskeletal: Normal range of motion.  Neurological: She is alert and oriented to person, place, and time. She displays normal reflexes.  Negative clonus.   Skin: Skin is warm. She is not diaphoretic.  Psychiatric: Her behavior is normal.   MAU Course  Procedures  None  MDM  CBC with diff, CMP, Cath PCR with urine culture G/C collected off urine  She declined the need for pain medication at this time.  Temp 101 now,  Tylenol 1 gram given PO  PreE protocol initiated, RN Heywood Bene made aware. Procardia given oral  Discussed patient's BP and exam with Dr. Ilda Basset; discussed admission with IV magnesium and antibiotics for endometritis. Discussed with Dr. Ronita Hipps who is agreeable with plan. Dr. Ronita Hipps aware of lab results and BP readings. Magnesium orders and antibiotic orders placed.   Assessment and Plan   A:  1. Hypertension in pregnancy, preeclampsia, severe, delivered/postpartum   2. Endometritis following delivery   3. Fever and chills     P:  Admit to high risk OB Lr @ 100 ml/Hr Gentamycin per pharmacy Clindamycin 900 mg IV Q8 hours Please call Dr. Ronita Hipps for further orders for admission Magnesium started IV Tylenol Q6 hours for fever  Courtney Harmon, Artist Pais,  NP 12/09/2018 7:32 PM

## 2018-12-10 DIAGNOSIS — N719 Inflammatory disease of uterus, unspecified: Secondary | ICD-10-CM | POA: Diagnosis present

## 2018-12-10 DIAGNOSIS — O1410 Severe pre-eclampsia, unspecified trimester: Secondary | ICD-10-CM | POA: Diagnosis present

## 2018-12-10 LAB — COMPREHENSIVE METABOLIC PANEL
ALT: 83 U/L — ABNORMAL HIGH (ref 0–44)
AST: 42 U/L — ABNORMAL HIGH (ref 15–41)
Albumin: 2.5 g/dL — ABNORMAL LOW (ref 3.5–5.0)
Alkaline Phosphatase: 106 U/L (ref 38–126)
Anion gap: 6 (ref 5–15)
BUN: 6 mg/dL (ref 6–20)
CO2: 27 mmol/L (ref 22–32)
Calcium: 7.8 mg/dL — ABNORMAL LOW (ref 8.9–10.3)
Chloride: 103 mmol/L (ref 98–111)
Creatinine, Ser: 0.6 mg/dL (ref 0.44–1.00)
GFR calc Af Amer: >60 mL/min (ref >60)
GFR calc non Af Amer: >60 mL/min (ref >60)
Glucose, Bld: 119 mg/dL — ABNORMAL HIGH (ref 70–99)
Potassium: 3.5 mmol/L (ref 3.5–5.1)
Sodium: 136 mmol/L (ref 135–145)
Total Bilirubin: 0.7 mg/dL (ref 0.3–1.2)
Total Protein: 5.8 g/dL — ABNORMAL LOW (ref 6.5–8.1)

## 2018-12-10 LAB — CBC
HCT: 31.6 % — ABNORMAL LOW (ref 36.0–46.0)
Hemoglobin: 10.3 g/dL — ABNORMAL LOW (ref 12.0–15.0)
MCH: 29.8 pg (ref 26.0–34.0)
MCHC: 32.6 g/dL (ref 30.0–36.0)
MCV: 91.3 fL (ref 80.0–100.0)
Platelets: 245 10*3/uL (ref 150–400)
RBC: 3.46 MIL/uL — ABNORMAL LOW (ref 3.87–5.11)
RDW: 15.9 % — ABNORMAL HIGH (ref 11.5–15.5)
WBC: 15.8 10*3/uL — ABNORMAL HIGH (ref 4.0–10.5)
nRBC: 0 % (ref 0.0–0.2)

## 2018-12-10 LAB — MAGNESIUM: Magnesium: 4.4 mg/dL — ABNORMAL HIGH (ref 1.7–2.4)

## 2018-12-10 MED ORDER — COCONUT OIL OIL
1.0000 "application " | TOPICAL_OIL | Status: DC | PRN
  Administered 2018-12-10: 1 via TOPICAL

## 2018-12-10 NOTE — Progress Notes (Signed)
Patient ID: Courtney Harmon, female   DOB: 20-Apr-1981, 38 y.o.   MRN: [id number] No complaints BP 120/70 (BP Location: Right Arm)   Pulse (!) 105   Temp 98.2 F (36.8 C) (Oral)   Resp 16   Ht 5\' 3"  (1.6 m)   Wt 84.2 kg   SpO2 95%   BMI 32.90 kg/m   Tm 99 UO 1400 Continue Mag SO4 x 24hrs Rpt labs 1800

## 2018-12-10 NOTE — Progress Notes (Signed)
Patient ID: Courtney Harmon, female   DOB: May 25, 1981, 38 y.o.   MRN: [id number]  Readmit/Post Partum Day #5 / HD#1  S/P NSVB at [facility]  Patient seen in MAU last night, abdominal tenderness and blood pressure in severe range, admitted to the hospital for magnesium sulfate prophylaxis and antibiotics intravenously.  Patient had been breast-feeding newborn with moderate latch success. Subjective: Mild HA, no scotomata, no SOB, no CP, breast symptoms - pain with latch, lots of milk.  Pain minimal, abdominal cramping improved since hospital admit, no further chills.  Normal vaginal bleeding, no clots.   Voiding freely.    Objective:  VS:  Vitals:   12/10/18 0600 12/10/18 0725 12/10/18 0830 12/10/18 0935  BP:  120/70    Pulse:  (!) 105    Resp: _0 Temp:  98.2 F (36.8 C)    TempSrc:  Oral    SpO2:  95%    Weight:      Height:         Intake/Output Summary (Last 24 hours) at 12/10/2018 1012 Last data filed at 12/10/2018 0817 Gross per 24 hour  Intake 3593.66 ml  Output 3150 ml  Net 443.66 ml      Recent Labs    12/09/18 1810  WBC 15.4*  HGB 10.8*  HCT 33.6*  PLT 243   Hepatic Function Latest Ref Rng & Units 12/09/2018  Total Protein 6.5 - 8.1 g/dL 6.1(L)  Albumin 3.5 - 5.0 g/dL 2.9(L)  AST 15 - 41 U/L 51(H)  ALT 0 - 44 U/L 100(H)  Alk Phosphatase 38 - 126 U/L 113  Total Bilirubin 0.3 - 1.2 mg/dL 0.9   Blood type: --/--/O POS, O POS Performed at [facility] [address] [place], [place] [address]  [phone]/25 1951) Rubella:   immune   Physical Exam:  General: alert, cooperative and no distress  Breasts: mild nipple trauma, no sx of mastitis Uterine Fundus: firm, no to palpation Lochia: appropriate Perineum: intact, mild pedal edema DVT Evaluation: No cords or calf tenderness. No calf/ankle edema.    Assessment/Plan: PPD # 5 / HD#1 / 38 y.o., G2P2002    Active Problems:   Severe preeclampsia improving  - Mag Sulfate  therapy x 24 hrs, DC tonight at 2000  - normotensive, good diuresis, consider antiHTN meds PRN  - rpt labs this evening per MD consult   Endometritis improving, no fundal tenderness and afebrile today  - Gent and clinda IV regimen inpatient until afebrile x 24-48 hrs  - per UTD, oral regimen outpatient does not contribute to improved outcomes and therefore not recommended Breastfeeding / difficult latch and nipple trauma  - lactation assistance today  - pump and feed today and use NS PRN  - outpatient consult for possible tongue tie of newborn Postpartum care  - routine, add abdominal binder for comfort  Anticipate DC in AM Appreciate co-management from Dr. Ronita Hipps   LOS: 1 day   Courtney Harmon, CNM, MSN 12/10/2018, 10:12 AM

## 2018-12-10 NOTE — Progress Notes (Addendum)
1710- Patient complains of "unable to speak, cannot talk to people" patient states it was difficult to speak to CNM earlier. Assessment performed by RN, patient does not exhibit any facial drooping, no arm swag, patient slow to speak but no slurring noted. Dr Ronita Hipps called and updated on patient complaints and exam. Orders received for Mag level stat and obtain CBC,CMET ordered for 6pm now. 1800- Pam RN requested Eleanora Neighbor RN to assess patient in regards to her complaints, Nelida Gores agrees that neuro exam is WNL.  1905- Lab results to Dr. Ronita Hipps, pt continues to complain of difficulty talking and expressing herself. Assessment continues to exhibit no signs of facila drooping, slurred speech or changes in bilateral strength. Per MD, continue as planned to d/c magnesium at 24 hrs and continue antibiotics through the night.

## 2018-12-10 NOTE — Progress Notes (Signed)
Patient ID: Courtney Harmon, female   DOB: 04-14-1981, 38 y.o.   MRN: [id number] HD #1 PP PEC/PP ENDOMETRITIS  S: No complaints Headache improved Abdominal tenderness present but decreased No visual changes, epigastric pain or SOB BP 130/74 (BP Location: Left Arm)   Pulse 100   Temp 98.9 F (37.2 C) (Oral)   Resp 17   Ht 5\' 3"  (1.6 m)   Wt 84.2 kg   SpO2 97%   BMI 32.90 kg/m   HEENT nl Neck : supple Lungs: CTA CV: RRR Abd: fundus mildly tender, no rebound or guarding VE deferred Ext : 2+ DTRs. Neuro: nonfoca Skin : intact  Labs at 1800 tonight UO 2800  IMP: PP PEC on Mag prophylaxis PP endometritis clinically improved  P: IV ABX Mag until 2000 Rpt labs 1800 Inpatient management

## 2018-12-10 NOTE — Lactation Note (Signed)
Lactation Consultation Note  Patient Name: Courtney Harmon Today's Date: 12/10/2018 Reason for consult: Initial assessment   P2, 5 day old readmit that delivered at [place].   Per FOB baby has had 7% weight loss. Discussed monitoring voids/stools.  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 Parents are calling in the morning tomorrow to make an appt. For this child to be evaluated by specialist.  Parents were unsure of name.   LC did not perform evaluation on  sleeping infant.  Mother is using shells for cracked/sore nipples. She had been using a #24NS at home and did not bring it to the hospital. [facility] #24NS.  Mother recently pumped 3-4 oz + with manual pump and baby received approx 30 ml with slow flow nipple. Set up DEBP.   Mom knows to pump q3h for 15-20 min. Reviewed cleaning, milk storage. Mother plans to continue to latch baby first with #24NS and post pump as tolerated. Encouraged increasing amount of breastmilk per day of life and as baby desires.     Maternal Data Has patient been taught Hand Expression?: Yes Does the patient have breastfeeding experience prior to this delivery?: Yes  Feeding    LATCH Score                   Interventions Interventions: DEBP  Lactation Tools Discussed/Used Tools: Pump;Shells;Nipple Courtney Harmon Pump Review: Setup, frequency, and cleaning;Milk Storage Initiated by:: Courtney Master RN IBCLC Date initiated:: 12/10/18   Consult Status Consult Status: PRN    Courtney Harmon 12/10/2018, 1:14 PM

## 2018-12-11 ENCOUNTER — Encounter (HOSPITAL_COMMUNITY): Payer: Self-pay

## 2018-12-11 LAB — GC/CHLAMYDIA PROBE AMP (~~LOC~~) NOT AT ARMC
Chlamydia: NEGATIVE
Neisseria Gonorrhea: NEGATIVE

## 2018-12-11 LAB — CULTURE, OB URINE
Culture: NO GROWTH
Special Requests: NORMAL

## 2018-12-11 MED ORDER — HYDROCHLOROTHIAZIDE 25 MG PO TABS
25.0000 mg | ORAL_TABLET | Freq: Every day | ORAL | Status: DC
  Administered 2018-12-11 – 2018-12-12 (×2): 25 mg via ORAL
  Filled 2018-12-11 (×2): qty 1

## 2018-12-11 MED ORDER — NIFEDIPINE ER OSMOTIC RELEASE 30 MG PO TB24
30.0000 mg | ORAL_TABLET | Freq: Two times a day (BID) | ORAL | Status: DC
  Administered 2018-12-11 – 2018-12-12 (×3): 30 mg via ORAL
  Filled 2018-12-11 (×5): qty 1

## 2018-12-11 NOTE — Progress Notes (Addendum)
Patient ID: Courtney Harmon, female   DOB: 13-Jul-1981, 38 y.o.   MRN: [id number] HD #2 PP PEC/PP ENDOMETRITIS  S: No complaints Headache improved Abdominal tenderness present but decreased No visual changes, epigastric pain or SOB O: BP (!) 155/91 (BP Location: Right Arm) Comment: Rn notified  Pulse 75   Temp 98.4 F (36.9 C) (Oral)   Resp 18   Ht 5\' 3"  (1.6 m)   Wt 84.2 kg   SpO2 98%   BMI 32.90 kg/m   Tm 100.7 at 1620 HEENT nl Neck : supple Lungs: CTA CV: RRR Abd: fundus mildly tender, no rebound or guarding VE deferred Ext : 2+ DTRs. Neuro: nonfocal Skin : intact  CBC    Component Value Date/Time   WBC 15.8 (H) 12/10/2018 1754   RBC 3.46 (L) 12/10/2018 1754   HGB 10.3 (L) 12/10/2018 1754   HCT 31.6 (L) 12/10/2018 1754   PLT 245 12/10/2018 1754   MCV 91.3 12/10/2018 1754   MCH 29.8 12/10/2018 1754   MCHC 32.6 12/10/2018 1754   RDW 15.9 (H) 12/10/2018 1754   LYMPHSABS 0.9 12/09/2018 1810   MONOABS 0.7 12/09/2018 1810   EOSABS 0.1 12/09/2018 1810   BASOSABS 0.0 12/09/2018 1810   CMP     Component Value Date/Time   NA 136 12/10/2018 1754   K 3.5 12/10/2018 1754   CL 103 12/10/2018 1754   CO2 27 12/10/2018 1754   GLUCOSE 119 (H) 12/10/2018 1754   BUN 6 12/10/2018 1754   CREATININE 0.60 12/10/2018 1754   CALCIUM 7.8 (L) 12/10/2018 1754   PROT 5.8 (L) 12/10/2018 1754   ALBUMIN 2.5 (L) 12/10/2018 1754   AST 42 (H) 12/10/2018 1754   ALT 83 (H) 12/10/2018 1754   ALKPHOS 106 12/10/2018 1754   BILITOT 0.7 12/10/2018 1754   GFRNONAA >60 12/10/2018 1754   GFRAA >60 12/10/2018 1754     UO adequate  IMP: PP PEC off Mag prophylaxis- BPs labile PP endometritis clinically improved  P: IV ABX Start Procardia Rpt labs am Inpatient management

## 2018-12-11 NOTE — Progress Notes (Signed)
Interval note  VS: BP 132/78 (BP Location: Right Arm)   Pulse 83   Temp 99 F (37.2 C) (Oral)   Resp 18   Ht 5\' 3"  (1.6 m)   Wt 84.2 kg   SpO2 98%   BMI 32.90 kg/m    BP: 143/92 - 145/95 - 153/97 - 141/93       I&O: significant diuresis today after HCTZ - out 5169ml            previous net +3087 - no significant diuresis with mag / now net - 2983   Intake/Output      04/27 0701 - 04/28 0700   P.O. 2013   I.V. (mL/kg)    IV Piggyback 103.2   Total Intake(mL/kg) 2116.2 (25.1)   Urine (mL/kg/hr) 5100 (3.6)   Total Output 5100   Net -2983.8        A/P: PPD # 6             PEC - now with diuresis after HCTZ / continue Procardia and HCTZ in AM                        CMP in AM for LE trend             Endometritis                        febrile 4/26 4pm - now temp range 98.3 to 99.0                      CBC in am to reassess leukocytosis  Artelia Laroche CNM, MSN, [facility] 12/11/2018, 11:41 PM

## 2018-12-11 NOTE — Progress Notes (Addendum)
PPD #6 SVD with hospital readmit day #2  S:  Reports feeling much better - bleeding less and minimal abdominal pain             Tolerating po/ No nausea or vomiting             Bleeding is light             Pain controlled with Motrin and Tylenol             Up ad lib / ambulatory / voiding QS  Newborn at bedside Breastfeeding well - no engorgement or knots  O: VS: BP (!) 155/91 (BP Location: Right Arm) Comment: Rn notified  Pulse 75   Temp 98.4 F (36.9 C) (Oral)   Resp 18   Ht 5\' 3"  (1.6 m)   Wt 84.2 kg   SpO2 98%   BMI 32.90 kg/m    BP: 141/93 - 155/91 - 146/94 - 127/80 - 130/81  Last febrile temp 4pm yesterday (100.7)   LABS:             Recent Labs    12/09/18 1810 12/10/18 1754  WBC 15.4* 15.8*  HGB 10.8* 10.3*  PLT 243 245                I&O: net posiitve 3015ml              Physical Exam:             Alert and oriented X3  Lungs: clear and unlabored  Heart: regular rate and rhythm / no mumurs  Abdomen: soft, non-tender, non-distended        Lochia: light  Extremities: no edema, no calf pain or tenderness    A: PPD # 6 / readmit day 2             Preeclampsia             Endometritis likely with some subinvolution    P: Routine post partum orders  HCTZ in addition to procardia today - continue monitoring BP Q4 hours              continue ABX until completely afebrile and non-tender             repeat labs in AM - trending down LE and reassessment of leukocytosis               Consult with Dr Ronita Hipps - agrees with plan & will see/review  Artelia Laroche CNM, MSN, [facility] 12/11/2018, 7:51 AM

## 2018-12-12 LAB — CBC
HCT: 34.9 % — ABNORMAL LOW (ref 36.0–46.0)
Hemoglobin: 11.5 g/dL — ABNORMAL LOW (ref 12.0–15.0)
MCH: 29.9 pg (ref 26.0–34.0)
MCHC: 33 g/dL (ref 30.0–36.0)
MCV: 90.6 fL (ref 80.0–100.0)
Platelets: 284 10*3/uL (ref 150–400)
RBC: 3.85 MIL/uL — ABNORMAL LOW (ref 3.87–5.11)
RDW: 15.8 % — ABNORMAL HIGH (ref 11.5–15.5)
WBC: 9.3 10*3/uL (ref 4.0–10.5)
nRBC: 0 % (ref 0.0–0.2)

## 2018-12-12 LAB — COMPREHENSIVE METABOLIC PANEL
ALT: 118 U/L — ABNORMAL HIGH (ref 0–44)
AST: 52 U/L — ABNORMAL HIGH (ref 15–41)
Albumin: 2.7 g/dL — ABNORMAL LOW (ref 3.5–5.0)
Alkaline Phosphatase: 106 U/L (ref 38–126)
Anion gap: 9 (ref 5–15)
BUN: 7 mg/dL (ref 6–20)
CO2: 25 mmol/L (ref 22–32)
Calcium: 9.4 mg/dL (ref 8.9–10.3)
Chloride: 105 mmol/L (ref 98–111)
Creatinine, Ser: 0.6 mg/dL (ref 0.44–1.00)
GFR calc Af Amer: >60 mL/min (ref >60)
GFR calc non Af Amer: >60 mL/min (ref >60)
Glucose, Bld: 94 mg/dL (ref 70–99)
Potassium: 3.6 mmol/L (ref 3.5–5.1)
Sodium: 139 mmol/L (ref 135–145)
Total Bilirubin: 0.8 mg/dL (ref 0.3–1.2)
Total Protein: 6.3 g/dL — ABNORMAL LOW (ref 6.5–8.1)

## 2018-12-12 MED ORDER — COCONUT OIL OIL: 1.0000 "application " | TOPICAL_OIL | 0 refills | Status: DC | PRN

## 2018-12-12 MED ORDER — HYDROCHLOROTHIAZIDE 25 MG PO TABS: 25.0000 mg | ORAL_TABLET | Freq: Every day | ORAL | 0 refills | Status: DC

## 2018-12-12 MED ORDER — NIFEDIPINE ER 30 MG PO TB24: 30.0000 mg | ORAL_TABLET | Freq: Two times a day (BID) | ORAL | 1 refills | Status: DC

## 2018-12-12 MED ORDER — ACETAMINOPHEN 500 MG PO TABS: 1000.0000 mg | ORAL_TABLET | Freq: Four times a day (QID) | ORAL | 0 refills | Status: DC | PRN

## 2018-12-12 NOTE — Discharge Summary (Signed)
Physician Discharge Summary  Patient ID: Courtney Harmon MRN: [id number] DOB/AGE: January 22, 1981 38 y.o.  Admit date: 12/09/2018 Discharge date: 12/12/2018  Admission Diagnoses: Severe preeclampsia postpartum and endometritis  Discharge Diagnoses:  Active Problems:   Severe preeclampsia   Endometritis   Discharged Condition: stable  Hospital Course: Patient admitted from home on postpartum day #4, complaint of chills and abdominal pain for previous 24 hours and worsening.  On admit patient's blood pressures were noted to be in severe range and lab work showed elevated liver enzymes consistent with postpartum preeclampsia.  Also noted clinical symptoms of endometritis with fundal tenderness, increased bleeding, and leukocytosis.  Patient was started on magnesium sulfate x24 hours prophylaxis, dual IV antibiotics, and antihypertensive medication.  On hospital day 2 liver enzymes improved and magnesium sulfate therapy was discontinued.  Antibiotic therapy maintained x48 hours afebrile, and then discontinued on hospital day 3.  Patient's improved significantly and was discharged home stable on day 3, of note liver enzymes with small increase on third day.  Blood pressure was labile on day 2 and day 3 of hospital stay, maintained in mild range with antihypertensive medications; patient to be followed outpatient with repeat blood pressure check and repeat labs in 3 days.  Consults: None  Significant Diagnostic Studies: labs:  CBC Latest Ref Rng & Units 12/12/2018 12/10/2018 12/09/2018  WBC 4.0 - 10.5 K/uL 9.3 15.8(H) 15.4(H)  Hemoglobin 12.0 - 15.0 g/dL 11.5(L) 10.3(L) 10.8(L)  Hematocrit 36.0 - 46.0 % 34.9(L) 31.6(L) 33.6(L)  Platelets 150 - 400 K/uL 284 245 243   Hepatic Function Latest Ref Rng & Units 12/12/2018 12/10/2018 12/09/2018  Total Protein 6.5 - 8.1 g/dL 6.3(L) 5.8(L) 6.1(L)  Albumin 3.5 - 5.0 g/dL 2.7(L) 2.5(L) 2.9(L)  AST 15 - 41 U/L 52(H) 42(H) 51(H)  ALT 0 - 44 U/L 118(H) 83(H)  100(H)  Alk Phosphatase 38 - 126 U/L 106 106 113  Total Bilirubin 0.3 - 1.2 mg/dL 0.8 0.7 0.9    Treatments: antibiotics: gentamycin and clindamycin IV x 48 hours Magnesium Sulfate prophylaxis x 24 hrs Oral antihypertensive and diuretic - Procardia and HCTZ  Discharge Exam: Blood pressure (!) 142/90, pulse 81, temperature 98.7 F (37.1 C), resp. rate 16, height 5' 3" (1.6 m), weight 84.2 kg, SpO2 99 %. General appearance: alert, cooperative and no distress Breasts: lactating, no masses Cardio: S1, S2 normal GI: soft, non-tender; bowel sounds normal; no masses,  no organomegaly and no fundal tenderness, uterus firm at U-2 Extremities: extremities normal, atraumatic, no cyanosis or edema Neurologic: Grossly normal  Disposition: Discharge disposition: 01-Home or Self Care       Discharge Instructions    Activity as tolerated   Complete by:  As directed    Ambulatory referral to Lactation   Complete by:  As directed    Reason for consult:  The Mother-Infant Dyad Needs Assistance in the Continuation of Breastfeeding   Call MD for:  difficulty breathing, headache or visual disturbances   Complete by:  As directed    Call MD for:  persistant dizziness or light-headedness   Complete by:  As directed    Call MD for:  persistant nausea and vomiting   Complete by:  As directed    Call MD for:  redness, tenderness, or signs of infection (pain, swelling, redness, odor or green/yellow discharge around incision site)   Complete by:  As directed    Call MD for:  severe uncontrolled pain   Complete by:  As directed  Call MD for:  temperature >100.4   Complete by:  As directed    Diet general   Complete by:  As directed    Discharge instructions   Complete by:  As directed    WOB instructions booklet   Sexual acrtivity   Complete by:  As directed    Pelvic rest until bleeding stopped, then resume sexual activity when ready.     Allergies as of 12/12/2018   No Known Allergies      Medication List    TAKE these medications   acetaminophen 500 MG tablet Commonly known as:  TYLENOL Take 2 tablets (1,000 mg total) by mouth every 6 (six) hours as needed for mild pain or fever.   coconut oil Oil Apply 1 application topically as needed.   Fish Oil 1000 MG Caps Take 1 capsule by mouth daily.   hydrochlorothiazide 25 MG tablet Commonly known as:  HYDRODIURIL Take 1 tablet (25 mg total) by mouth daily. Start taking on:  December 13, 2018   NIFEdipine 30 MG 24 hr tablet Commonly known as:  ADALAT CC Take 1 tablet (30 mg total) by mouth every 12 (twelve) hours.   prenatal multivitamin Tabs tablet Take 1 tablet by mouth daily at 12 noon.   Vitamin D 50 MCG (2000 UT) tablet Take 2,000 Units by mouth daily.      Follow-up Information    [name], CNM. Schedule an appointment as soon as possible for a visit in 3 day(s).   Specialty:  Obstetrics and Gynecology Why:  BP and labs check Contact information: [address] [place] [address] [phone]           Signed: [name], CNM 12/12/2018, 11:05 AM

## 2018-12-12 NOTE — Progress Notes (Signed)
Patient ID: Courtney Harmon, female   DOB: 1981/06/17, 38 y.o.   MRN: [id number] HD #3 PP PEC/PP ENDOMETRITIS  S: No complaints Headache improved Abdominal tenderness present but decreased No visual changes, epigastric pain or SOB O: BP (!) 147/102 (BP Location: Right Arm)   Pulse 84   Temp 98.6 F (37 C) (Oral)   Resp 18   Ht 5\' 3"  (1.6 m)   Wt 84.2 kg   SpO2 99%   BMI 32.90 kg/m   Tm 100.7 at 1620 HEENT nl Neck : supple Lungs: CTA CV: RRR Abd: fundus mildly tender, no rebound or guarding VE deferred Ext : 2+ DTRs. Neuro: nonfocal Skin : intact  CBC    Component Value Date/Time   WBC 9.3 12/12/2018 0602   RBC 3.85 (L) 12/12/2018 0602   HGB 11.5 (L) 12/12/2018 0602   HCT 34.9 (L) 12/12/2018 0602   PLT 284 12/12/2018 0602   MCV 90.6 12/12/2018 0602   MCH 29.9 12/12/2018 0602   MCHC 33.0 12/12/2018 0602   RDW 15.8 (H) 12/12/2018 0602   LYMPHSABS 0.9 12/09/2018 1810   MONOABS 0.7 12/09/2018 1810   EOSABS 0.1 12/09/2018 1810   BASOSABS 0.0 12/09/2018 1810   CMP     Component Value Date/Time   NA 136 12/10/2018 1754   K 3.5 12/10/2018 1754   CL 103 12/10/2018 1754   CO2 27 12/10/2018 1754   GLUCOSE 119 (H) 12/10/2018 1754   BUN 6 12/10/2018 1754   CREATININE 0.60 12/10/2018 1754   CALCIUM 7.8 (L) 12/10/2018 1754   PROT 5.8 (L) 12/10/2018 1754   ALBUMIN 2.5 (L) 12/10/2018 1754   AST 42 (H) 12/10/2018 1754   ALT 83 (H) 12/10/2018 1754   ALKPHOS 106 12/10/2018 1754   BILITOT 0.7 12/10/2018 1754   GFRNONAA >60 12/10/2018 1754   GFRAA >60 12/10/2018 1754     UO adequate/excellent  IMP: PP PEC off Mag prophylaxis- BPs labile but in mild range PP endometritis clinically improved, Leukocytosis resolved  P: DC IV ABX today Continue Procardia/HCTZ Can dc home and fu as outpt

## 2018-12-15 DIAGNOSIS — R030 Elevated blood-pressure reading, without diagnosis of hypertension: Secondary | ICD-10-CM | POA: Diagnosis not present

## 2018-12-15 DIAGNOSIS — O1410 Severe pre-eclampsia, unspecified trimester: Secondary | ICD-10-CM | POA: Diagnosis not present

## 2018-12-20 DIAGNOSIS — N710 Acute inflammatory disease of uterus: Secondary | ICD-10-CM | POA: Diagnosis not present

## 2019-01-02 DIAGNOSIS — F4323 Adjustment disorder with mixed anxiety and depressed mood: Secondary | ICD-10-CM | POA: Diagnosis not present

## 2019-03-05 ENCOUNTER — Encounter: Payer: Self-pay | Admitting: Certified Nurse Midwife

## 2019-03-05 ENCOUNTER — Ambulatory Visit (INDEPENDENT_AMBULATORY_CARE_PROVIDER_SITE_OTHER): Payer: BLUE CROSS/BLUE SHIELD | Admitting: Certified Nurse Midwife

## 2019-03-05 ENCOUNTER — Other Ambulatory Visit: Payer: Self-pay

## 2019-03-05 ENCOUNTER — Other Ambulatory Visit (HOSPITAL_COMMUNITY)
Admission: RE | Admit: 2019-03-05 | Discharge: 2019-03-05 | Disposition: A | Discharge status: Home / Self Care | Payer: BLUE CROSS/BLUE SHIELD | Source: Ambulatory Visit | Attending: Certified Nurse Midwife | Admitting: Certified Nurse Midwife

## 2019-03-05 VITALS — BP 125/89 | HR 80 | Ht 63.0 in | Wt 173.3 lb

## 2019-03-05 DIAGNOSIS — R102 Pelvic and perineal pain unspecified side: Secondary | ICD-10-CM

## 2019-03-05 DIAGNOSIS — N941 Unspecified dyspareunia: Secondary | ICD-10-CM

## 2019-03-05 DIAGNOSIS — M6208 Separation of muscle (nontraumatic), other site: Secondary | ICD-10-CM

## 2019-03-05 DIAGNOSIS — E559 Vitamin D deficiency, unspecified: Secondary | ICD-10-CM | POA: Diagnosis not present

## 2019-03-05 DIAGNOSIS — R200 Anesthesia of skin: Secondary | ICD-10-CM | POA: Diagnosis not present

## 2019-03-05 NOTE — Patient Instructions (Addendum)
Pelvic Pain, Female Pelvic pain is pain in your lower belly (abdomen), below your belly button and between your hips. The pain may start suddenly (be acute), keep coming back (be recurring), or last a long time (become chronic). Pelvic pain that lasts longer than 6 months is called chronic pelvic pain. There are many causes of pelvic pain. Sometimes the cause of pelvic pain is not known. Follow these instructions at home:   Take over-the-counter and prescription medicines only as told by your doctor.  Rest as told by your doctor.  Do not have sex if it hurts.  Keep a journal of your pelvic pain. Write down: ? When the pain started. ? Where the pain is located. ? What seems to make the pain better or worse, such as food or your period (menstrual cycle). ? Any symptoms you have along with the pain.  Keep all follow-up visits as told by your doctor. This is important. Contact a doctor if:  Medicine does not help your pain.  Your pain comes back.  You have new symptoms.  You have unusual discharge or bleeding from your vagina.  You have a fever or chills.  You are having trouble pooping (constipation).  You have blood in your pee (urine) or poop (stool).  Your pee smells bad.  You feel weak or light-headed. Get help right away if:  You have sudden pain that is very bad.  Your pain keeps getting worse.  You have very bad pain and also have any of these symptoms: ? A fever. ? Feeling sick to your stomach (nausea). ? Throwing up (vomiting). ? Being very sweaty.  You pass out (lose consciousness). Summary  Pelvic pain is pain in your lower belly (abdomen), below your belly button and between your hips.  There are many possible causes of pelvic pain.  Keep a journal of your pelvic pain. This information is not intended to replace advice given to you by your health care provider. Make sure you discuss any questions you have with your health care provider. Document  Released: 01/19/2008 Document Revised: 01/18/2018 Document Reviewed: 01/18/2018 Elsevier Patient Education  [place].  Dyspareunia, Female Dyspareunia is pain that is associated with sexual activity. This can affect any part of the genitals or lower abdomen. There are many possible causes of this condition. In some cases, diagnosing the cause of dyspareunia can be difficult. This condition can be mild, moderate, or severe. Depending on the cause, dyspareunia may get better with treatment, but may return (recur) over time. What are the causes?  The cause of this condition is not always known. However, problems that affect the vulva, vagina, uterus, and other organs may cause dyspareunia. Common causes of this condition include:  Vaginal dryness.  Giving birth.  Infection.  Skin changes or conditions.  Side effects of medicines.  Endometriosis. This is when tissue that is like the lining of the uterus grows on the outside of the uterus.  Psychological conditions. These include depression, anxiety, or traumatic experiences.  Allergic reaction. What increases the risk? The following factors may make you more likely to develop this condition:  History of physical or sexual trauma.  Some medicines.  No longer having a monthly period (menopause).  Having recently given birth.  Taking baths using soaps that have perfumes. These can cause irritation.  Douching. What are the signs or symptoms? The main symptom of this condition is pain in any part of your genitals or lower abdomen during or after sex.  This may include:  Irritation, burning, or stinging sensations in your vulva.  Discomfort when your vulva or surrounding area is touched.  Aching and throbbing pain that may be constant.  Pain that gets worse when something is inserted into your vagina. How is this diagnosed? This condition may be diagnosed based on:  Your symptoms, including where and when your pain  occurs.  Your medical history.  A physical exam. A pelvic exam will most likely be done.  Tests that include ultrasound, blood tests, and tests that check the body for infection.  Imaging tests, such as X-ray, MRI, and CT scan. You may be referred to a health care provider who specializes in women's health (gynecologist). How is this treated? Treatment depends on the cause of your condition and your symptoms. In most cases, you may need to stop sexual activity until your symptoms go away or get better. Treatment may include:  Lubricants, ointments, and creams.  Physical therapy.  Massage therapy.  Hormonal therapy.  Medicines to: ? Prevent or fight infection. ? Relieve pain. ? Help numb the area. ? Treat depression (antidepressants).  Counseling, which may include sex therapy.  Surgery. Follow these instructions at home: Lifestyle  Wear cotton underwear.  Use water-based lubricants as needed during sex. Avoid oil-based lubricants.  Do not use any products that can cause irritation. This may include certain condoms, spermicides, lubricants, soaps, tampons, vaginal sprays, or douches.  Always practice safe sex. Use a condom to prevent sexually transmitted infections (STIs).  Talk freely with your partner about your condition. General instructions  Take or apply over-the-counter and prescription medicines only as told by your health care provider.  Urinate before you have sex.  Consider joining a support group.  Get the results of any tests you have done. Ask your health care provider, or the department that is doing the procedure, when your results will be ready.  Keep all follow-up visits as told by your health care provider. This is important. Contact a health care provider if:  You have vaginal bleeding after having sex.  You develop a lump at the opening of your vagina even if the lump is painless.  You have: ? Abnormal discharge from your vagina. ?  Vaginal dryness. ? Itchiness or irritation of your vulva or vagina. ? A new rash. ? Symptoms that get worse or do not improve with treatment. ? A fever. ? Pain when you urinate. ? Blood in your urine. Get help right away if:  You have severe pain in your abdomen during or shortly after sex.  You pass out after sex. Summary  Dyspareunia is pain that is associated with sexual activity. This can affect any part of the genitals or lower abdomen.  There are many causes of this condition. Treatment depends on the cause and your symptoms. In most cases, you may need to stop sexual activity until your symptoms improve.  Take or apply over-the-counter and prescription medicines only as told by your health care provider.  Contact a health care provider if your symptoms get worse or do not improve with treatment.  Keep all follow-up visits as told by your health care provider. This is important. This information is not intended to replace advice given to you by your health care provider. Make sure you discuss any questions you have with your health care provider. Document Released: 08/22/2007 Document Revised: 10/09/2018 Document Reviewed: 10/09/2018 Elsevier Patient Education  [place]  Diastasis recti is when the  muscles of the abdomen (rectus abdominis muscles) become thin and separate. The result is a wider space between the right and left abdomen (abdominal) muscles. This wider space between the muscles may cause a bulge in the middle of your abdomen. You may notice this bulge when you are straining or when you sit up from a lying down position. Diastasis recti can affect men and women. It is most common among pregnant women, infants, people who are obese, and people who have had abdominal surgery. Exercise or surgical treatment may help correct it. What are the causes? Common causes of this condition include:  Pregnancy. The growing uterus puts pressure on the  abdominal muscles, which causes the muscles to separate.  Obesity. Excess fat puts pressure on abdominal muscles.  Weightlifting.  Some abdomen exercises.  Advanced age.  Genetics.  Prior abdominal surgery. What increases the risk? This condition is more likely to develop in:  Women.  Newborns, especially newborns who are born early (prematurely). What are the signs or symptoms? Common symptoms of this condition include:  A bulge in the middle of the abdomen. You will notice it most when you sit up or strain.  Pain in the low back, pelvis, or hips.  Constipation.  Inability to control when you urinate (urinary incontinence).  Bloating.  Poor posture. How is this diagnosed? This condition is diagnosed with a physical exam. Your health care provider will ask you to lie flat on your back and do a crunch or half sit-up. If you have diastasis recti, a vertical bulge will appear between your abdominal muscles in the center of your abdomen. Your health care provider will measure the gap between your muscles with one of the following:  A medical device used to measure the space between two objects (caliper).  A tape measure.  CT scan.  Ultrasound.  Finger spaces. Your health care provider will measure the space using their fingers. How is this treated? If your muscle separation is not too large, you may not need treatment. However, if you are a woman who plans to become pregnant again, you should treat this condition before your next pregnancy. Treatment may include:  Physical therapy to strengthen and tighten your abdominal muscles.  Lifestyle changes such as weight loss and exercise.  Over-the-counter pain medicines as needed.  Surgery to correct the separation. Follow these instructions at home: Activity  Return to your normal activities as told by your health care provider. Ask your health care provider what activities are safe for you.  When lifting weights or  doing exercises using your abdominal muscles or the muscles in the center of your body that give stability (core muscles), make sure you are doing your exercises and movements correctly. Proper form can help to prevent the condition from happening again. General instructions  If you are overweight, ask your health care provider for help with weight loss. Losing even a small amount of weight can help to improve your diastasis recti.  Take over-the-counter or prescription medicines only as told by your health care provider.  Do not strain. Straining can make the separation worse. Examples of straining include: ? Pushing hard to have a bowel movement, such as due to constipation. ? Lifting heavy objects, including children. ? Standing up and sitting down.  Take steps to prevent constipation: ? Drink enough fluid to keep your urine clear or pale yellow. ? Take over-the-counter or prescription medicines only as directed. ? Eat foods that are high in fiber, such as  fresh fruits and vegetables, whole grains, and beans. ? Limit foods that are high in fat and processed sugars, such as fried and sweet foods. Contact a health care provider if:  You notice a new bulge in your abdomen. Get help right away if:  You experience severe discomfort in your abdomen.  You develop severe abdominal pain along with nausea, vomiting, or fever. Summary  Diastasis recti is when the abdomen (abdominal) muscles become thin and separate. Your abdomen will stick out because the space between your right and left abdomen muscles has widened.  The most common symptom is a bulge in your abdomen. You will notice it most when you sit up or are straining.  This condition is diagnosed during a physical exam.  If the abdomen separation is not too big, you may choose not to have treatment. Otherwise, you may need to undergo physical therapy or surgery. This information is not intended to replace advice given to you by your  health care provider. Make sure you discuss any questions you have with your health care provider. Document Released: 09/27/2016 Document Revised: 07/15/2017 Document Reviewed: 09/27/2016 Elsevier Patient Education  2020 [employer].

## 2019-03-05 NOTE — Progress Notes (Signed)
GYN ENCOUNTER NOTE  Subjective:       Courtney Harmon is a 38 y.o. [phone] female is here for gynecologic evaluation of the following issues:  1. Pelvic pain 2. Bilateral leg numbness 3. History of vitamin D deficiency 4. Diastasis recti 5. Dyspareunia  Notes symptoms since giving birth in April. Pelvic pain and bilateral leg numbness are constant, but worsen with physical active.   Feels like muscles are not strong and "everything is going to follow out with movement". Noticed bulge in abdomen when attempting to sit up.   Gynecologic History  No LMP recorded (lmp unknown). (Menstrual status: Lactating).  Contraception: vaginal spermicide  Last Pap: 2017. Results were: normal  Obstetric History  OB History  Gravida Para Term Preterm AB Living  2 2 2     2   SAB TAB Ectopic Multiple Live Births          2    # Outcome Date GA Lbr Len/2nd Weight Sex Delivery Anes PTL Lv  2 Term 12/05/18   7 lb 15 oz (3.6 kg) F Vag-Spont None N LIV  1 Term 04/04/16   7 lb 14 oz (3.572 kg) M Vag-Spont EPI N LIV    Past Medical History:  Diagnosis Date  . Gestational diabetes    With first pregnancy    Past Surgical History:  Procedure Laterality Date  . NO PAST SURGERIES      Current Outpatient Medications on File Prior to Visit  Medication Sig Dispense Refill  . acetaminophen (TYLENOL) 500 MG tablet Take 2 tablets (1,000 mg total) by mouth every 6 (six) hours as needed for mild pain or fever. 30 tablet 0  . Cholecalciferol (VITAMIN D) 50 MCG (2000 UT) tablet Take 2,000 Units by mouth daily.    . coconut oil OIL Apply 1 application topically as needed.  0  . hydrochlorothiazide (HYDRODIURIL) 25 MG tablet Take 1 tablet (25 mg total) by mouth daily. 7 tablet 0  . NIFEdipine (ADALAT CC) 30 MG 24 hr tablet Take 1 tablet (30 mg total) by mouth every 12 (twelve) hours. 30 tablet 1  . Omega-3 Fatty Acids (FISH OIL) 1000 MG CAPS Take 1 capsule by mouth daily.     . Prenatal Vit-Fe  Fumarate-FA (PRENATAL MULTIVITAMIN) TABS tablet Take 1 tablet by mouth daily at 12 noon.     No current facility-administered medications on file prior to visit.     No Known Allergies  Social History   Socioeconomic History  . Marital status: Married    Spouse name: Not on file  . Number of children: 1  . Years of education: Not on file  . Highest education level: Not on file  Occupational History  . Not on file  Social Needs  . Financial resource strain: Not on file  . Food insecurity    Worry: Not on file    Inability: Not on file  . Transportation needs    Medical: Not on file    Non-medical: Not on file  Tobacco Use  . Smoking status: Never Smoker  . Smokeless tobacco: Never Used  Substance and Sexual Activity  . Alcohol use: Yes    Comment: ocassional   . Drug use: Never  . Sexual activity: Yes    Birth control/protection: Other-see comments    Comment: Vaginal contraceptive Film  Lifestyle  . Physical activity    Days per week: Not on file    Minutes per session: Not on file  . Stress:  Not on file  Relationships  . Social Herbalist on phone: Not on file    Gets together: Not on file    Attends religious service: Not on file    Active member of club or organization: Not on file    Attends meetings of clubs or organizations: Not on file    Relationship status: Not on file  . Intimate partner violence    Fear of current or ex partner: Not on file    Emotionally abused: Not on file    Physically abused: Not on file    Forced sexual activity: Not on file  Other Topics Concern  . Not on file  Social History Narrative  . Not on file    Family History  Problem Relation Age of Onset  . Hyperlipidemia Mother   . Breast cancer Maternal Grandmother   . Ovarian cancer Neg Hx     The following portions of the patient's history were reviewed and updated as appropriate: allergies, current medications, past family history, past medical history, past  social history, past surgical history and problem list.  Review of Systems  ROS negative except as noted above. Information obtained from patient.   Objective:   BP 125/89   Pulse 80   Ht 5\' 3"  (1.6 m)   Wt 173 lb 4.8 oz (78.6 kg)   LMP  (LMP Unknown)   BMI 30.70 kg/m    CONSTITUTIONAL: Well-developed, well-nourished female in no acute distress.   ABDOMEN: Soft, non distended; Non tender.  No Organomegaly. Three (3) fingerbreath gap present with contraction of muscle.   PELVIC:  External Genitalia: Normal  Vagina: Normal  Cervix: Normal  Uterus: Normal size, shape,consistency, mobile  Adnexa: Normal   MUSCULOSKELETAL: Normal range of motion. No tenderness.  No cyanosis, clubbing, or edema.  Assessment:   1. Pelvic pain  2. Bilateral leg numbness  3. History vitamin D deficiency   4. Diastasis Recti  5. Dyspareunia  Plan:   Vaginal swab collected, see orders  Vitamin D level today, see orders  Discussed home treatment measures including use of V2 supports  Referral to pelvic floor PT, see orders  Reviewed red flag symptoms and when to call  RTC as needed   Diona Fanti, CNM [facility], [facility] 03/05/19 10:59 AM

## 2019-03-05 NOTE — Progress Notes (Signed)
Patient c/o constant pelvic pain and  bilateral leg numbness since most recent vaginal delivery, worse with physical activity.

## 2019-03-06 LAB — VITAMIN D 25 HYDROXY (VIT D DEFICIENCY, FRACTURES): Vit D, 25-Hydroxy: 20.7 ng/mL — ABNORMAL LOW (ref 30.0–100.0)

## 2019-03-08 ENCOUNTER — Other Ambulatory Visit: Payer: Self-pay

## 2019-03-08 ENCOUNTER — Encounter: Payer: Self-pay | Admitting: Certified Nurse Midwife

## 2019-03-08 LAB — CERVICOVAGINAL ANCILLARY ONLY
Bacterial vaginitis: NEGATIVE
Candida vaginitis: NEGATIVE
Trichomonas: NEGATIVE

## 2019-03-08 MED ORDER — VITAMIN D (ERGOCALCIFEROL) 1.25 MG (50000 UNIT) PO CAPS: 50000.0000 [IU] | ORAL_CAPSULE | ORAL | 2 refills | Status: DC

## 2019-03-19 ENCOUNTER — Encounter: Payer: Self-pay | Admitting: Physical Therapy

## 2019-03-19 ENCOUNTER — Ambulatory Visit: Payer: BLUE CROSS/BLUE SHIELD | Attending: Certified Nurse Midwife | Admitting: Physical Therapy

## 2019-03-19 ENCOUNTER — Other Ambulatory Visit: Payer: Self-pay

## 2019-03-19 DIAGNOSIS — M62838 Other muscle spasm: Secondary | ICD-10-CM | POA: Diagnosis not present

## 2019-03-19 DIAGNOSIS — R279 Unspecified lack of coordination: Secondary | ICD-10-CM

## 2019-03-19 DIAGNOSIS — M6281 Muscle weakness (generalized): Secondary | ICD-10-CM | POA: Diagnosis not present

## 2019-03-19 NOTE — Patient Instructions (Signed)
Access Code: [email or web address]  URL: [email or web address]  Date: 03/19/2019  Prepared by: Jari Favre   Exercises  Supine Diaphragmatic Breathing with Pelvic Floor Lengthening - 10 reps - 1 sets - 2x daily - 7x weekly  Supine Hamstring Stretch with Strap - 3 reps - 1 sets - 30 sec hold - 1x daily - 7x weekly  Standing Hamstring Stretch with Step - 3 reps - 1 sets - 30 sec hold - 1x daily - 7x weekly  Supine Hip Adductor Stretch - 10 reps - 3 sets - 1x daily - 7x weekly

## 2019-03-19 NOTE — Therapy (Addendum)
[facility] [address] [place], [place], [address] Phone: [phone]   Fax:  [phone]  Physical Therapy Evaluation  Patient Details  Name: Courtney Harmon MRN: [id number] Date of Birth: 1980/12/18 Referring Provider (PT): Verdene Rio Lara Mulch, CNM   Encounter Date: 03/19/2019 PT End of Session - 03/19/19 0839    Visit Number  1    Date for PT Re-Evaluation  05/14/19    PT Start Time  0839    PT Stop Time  0920    PT Time Calculation (min)  41 min    Activity Tolerance  Patient tolerated treatment well    Behavior During Therapy  [facility] for tasks assessed/performed         Past Medical History:  Diagnosis Date  . Gestational diabetes    With first pregnancy    Past Surgical History:  Procedure Laterality Date  . NO PAST SURGERIES      There were no vitals filed for this visit.     Subjective Assessment - 03/19/19 0841    Subjective  I had a baby in April and since then I have had some leg numbness that is constant.  I am feeling very weak in my core.  I feel a space in my stomach.  Running after the kids and pushing the stroller.  I feel weak when I am in bed at night.    Patient Stated Goals  feel stronger, be able to walk and run after kids and ride bike    Currently in Pain?  Yes    Pain Score  6     Pain Location  Abdomen    Pain Orientation  Mid;Lower    Pain Descriptors / Indicators  Sharp    Pain Type  Acute pain    Pain Radiating Towards  has constant numbness in legs but unsure if it is related    Pain Onset  More than a month ago    Pain Frequency  Intermittent    Aggravating Factors   walking a lot or running    Multiple Pain Sites  No          [facility] PT Assessment - 03/20/19 0001      Assessment   Medical Diagnosis  R10.2 (ICD-10-CM) - Pelvic pain; R20.0 (ICD-10-CM) - Bilateral leg numbness; M62.08 (ICD-10-CM) - Diastasis recti    Referring Provider (PT)  Verdene Rio, Lara Mulch, CNM    Prior Therapy  No      Precautions   Precautions  None      Restrictions   Weight Bearing Restrictions  No      Balance Screen   Has the patient fallen in the past 6 months  No      Home Environment   Living Environment  Private residence    Living Arrangements  Spouse/significant other;Children   39 yo son and baby     Prior Function   Level of Independence  Independent    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; professor     Cognition   Overall Cognitive Status  Within Functional Limits for tasks assessed      Posture/Postural Control   Posture/Postural Control  --    Postural Limitations  Rounded Shoulders;Increased thoracic kyphosis;Increased lumbar lordosis    Posture Comments  lateral shift to the Rt      AROM   Overall AROM Comments  lumbar flexion 75%      Strength  Overall Strength Comments  Lt hip flexion4/5      Flexibility   Soft Tissue Assessment /Muscle Length  yes    Hamstrings  75%      Palpation   Palpation comment  diastasis rectus 2 finger width      Ambulation/Gait   Gait Pattern  Within Functional Limits                Objective measurements completed on examination: See above findings.    Pelvic Floor Special Questions - 03/20/19 0001    Prior Pelvic/Prostate Exam  Yes    Are you Pregnant or attempting pregnancy?  No    Prior Pregnancies  Yes    Number of Pregnancies  2    Number of Vaginal Deliveries  2    Any difficulty with labor and deliveries  No    Currently Sexually Active  No    Marinoff Scale  pain prevents any attempts at intercourse   pain with penetration and deeper   Urinary Leakage  No    Urinary urgency  No    Fecal incontinence  Yes   just a small amount feels like I have to wipe a lot   Falling out feeling (prolapse)  No    Skin Integrity  Intact    Scar  Well healed;Painful;Restricted    Prolapse  None    Pelvic Floor Internal Exam  pt identity confirmed and informed consent given to perform  internal soft tissue assessment    Exam Type  Vaginal    Palpation  scar tissue posterior towards perineal body    Strength  weak squeeze, no lift   Rt stronger than Lt    Strength # of seconds  1    Tone  high; had difficutly relaxing               PT Education - 03/19/19 0920    Education Details  Access Code: [email or web address]    Person(s) Educated  Patient    Methods  Explanation;Demonstration;Verbal cues;Handout    Comprehension  Verbalized understanding       PT Short Term Goals - 03/20/19 1649      PT SHORT TERM GOAL #1   Title  ind with initial HEP    Time  4    Period  Weeks    Status  New    Target Date  04/16/19        PT Long Term Goals - 03/20/19 1649      PT LONG TERM GOAL #1   Title  ind with advanced HEP to maintain good mechanics with functional activities    Time  8    Period  Weeks    Status  New    Target Date  05/14/19      PT LONG TERM GOAL #2   Title  pt will report 2/3 Marinoff scale and understand how to progress towards less painful intercourse    Baseline  3/3    Time  8    Period  Weeks    Status  New    Target Date  05/14/19      PT LONG TERM GOAL #3   Title  Pt will demonstrate 1 finger width diastasis at the most    Baseline  2    Time  8    Period  Weeks    Status  New    Target Date  05/14/19      PT LONG TERM GOAL #  4   Title  Pt will report she is able to ride her bike due to feeling more core strength and stability    Time  8    Period  Weeks    Status  New    Target Date  05/14/19      PT LONG TERM GOAL #5   Title  Pt will report overall 60% less pain in abdomen    Time  8    Period  Weeks    Status  New    Target Date  05/14/19             Plan - 03/19/19 0920    Clinical Impression Statement  Pt presents to skilled PT due to feeling weak after delivery of her daughter in April.  She is also having pain with intercourse and leg numbness.  Pt has 2 finger diastasis of rectus abdominus.  She has LE  weakness and posture abnormalities as stated above.  Pt has tight and tender puboccygeus muscles.  Tight hamstrings and lumbar paraspinals.  Pt has weakness of pelvic floor 2/5 and only able to hold for 1 second.  She takes at least 5 seconds to relax and has high tone in pelvic floor.  Pt will benefit from skilled PT to address impairments, regain muscle strength and coordination for optimal trunk stability to perform functional tasks.    Personal Factors and Comorbidities  Comorbidity 1    Comorbidities  2 pregnancies    Examination-Activity Limitations  Lift;Locomotion Level    Examination-Participation Restrictions  Other   child care and recreation activities   Stability/Clinical Decision Making  Evolving/Moderate complexity    Clinical Decision Making  Moderate    PT Treatment/Interventions  ADLs/Self Care Home Management;Biofeedback;Cryotherapy;Electrical Stimulation;Moist Heat;Therapeutic activities;Therapeutic exercise;Neuromuscular re-education;Patient/family education;Manual techniques;Taping;Dry needling    PT Next Visit Plan  breathing and stretches, internal STM, lumbar STM, lumbar STM and lateral shift ROM    PT Home Exercise Plan  Access Code: [email or web address]    Consulted and Agree with Plan of Care  Patient       Patient will benefit from skilled therapeutic intervention in order to improve the following deficits and impairments:  Pain, Postural dysfunction, Decreased strength, Decreased coordination, Increased muscle spasms, Impaired tone  Visit Diagnosis: 1. Muscle weakness (generalized)   2. Other muscle spasm   3. Unspecified lack of coordination        Problem List Patient Active Problem List   Diagnosis Date Noted  . Severe preeclampsia 12/10/2018  . Endometritis 12/10/2018  . SOB (shortness of breath) 10/25/2018  . Palpitations 10/25/2018    Jule Ser, PT 03/20/2019, 5:01 PM  [facility] [address] [place], [place], [address] Phone: [phone]   Fax:  [phone]  Name: Courtney Harmon MRN: [id number] Date of Birth: 26-Sep-1980

## 2019-03-20 NOTE — Addendum Note (Signed)
Addended by: Su Hoff on: 03/20/2019 05:03 PM   Modules accepted: Orders

## 2019-03-27 ENCOUNTER — Encounter: Payer: Self-pay | Admitting: Physical Therapy

## 2019-03-27 ENCOUNTER — Other Ambulatory Visit: Payer: Self-pay

## 2019-03-27 ENCOUNTER — Ambulatory Visit: Payer: BLUE CROSS/BLUE SHIELD | Admitting: Physical Therapy

## 2019-03-27 DIAGNOSIS — R279 Unspecified lack of coordination: Secondary | ICD-10-CM

## 2019-03-27 DIAGNOSIS — M6281 Muscle weakness (generalized): Secondary | ICD-10-CM

## 2019-03-27 DIAGNOSIS — M62838 Other muscle spasm: Secondary | ICD-10-CM

## 2019-03-27 NOTE — Patient Instructions (Signed)
Access Code: [email or web address]  URL: [email or web address]  Date: 03/27/2019  Prepared by: Jari Favre   Exercises  Supine Diaphragmatic Breathing with Pelvic Floor Lengthening - 10 reps - 1 sets - 2x daily - 7x weekly  Supine Hamstring Stretch with Strap - 3 reps - 1 sets - 30 sec hold - 1x daily - 7x weekly  Standing Hamstring Stretch with Step - 3 reps - 1 sets - 30 sec hold - 1x daily - 7x weekly  Supine Hip Adductor Stretch - 10 reps - 3 sets - 1x daily - 7x weekly  Supine Transversus Abdominis Bracing - Hands on Stomach - 10 reps - 3 sets - 1x daily - 7x weekly  90/90 SI Joint Self-Correction with Dowel - 10 reps - 3 sets - 1x daily - 7x weekly  Cat-Camel to Child's Pose - 5 reps - 1 sets - 10 sec hold - 1x daily - 7x weekly

## 2019-03-27 NOTE — Therapy (Signed)
[facility] [address], [place], [place], [address] Phone: [phone]   Fax:  [phone]  Physical Therapy Treatment  Patient Details  Name: Courtney Harmon MRN: [id number] Date of Birth: 1981/06/09 Referring Provider (PT): Verdene Rio Lara Mulch, CNM   Encounter Date: 03/27/2019  PT End of Session - 03/27/19 0907    Visit Number  2    Date for PT Re-Evaluation  05/14/19    PT Start Time  8338    PT Stop Time  0927    PT Time Calculation (min)  40 min    Activity Tolerance  Patient tolerated treatment well    Behavior During Therapy  [facility] for tasks assessed/performed       Past Medical History:  Diagnosis Date  . Gestational diabetes    With first pregnancy    Past Surgical History:  Procedure Laterality Date  . NO PAST SURGERIES      There were no vitals filed for this visit.  Subjective Assessment - 03/27/19 0902    Subjective  My back on the right side feels locked up and it takes me a while to get comfortable at night because of that.  I haven't done much yet today so I don't have any pain    Patient Stated Goals  feel stronger, be able to walk and run after kids and ride bike    Currently in Pain?  No/denies                       [facility] Adult PT Treatment/Exercise - 03/27/19 0001      Self-Care   Self-Care  Other Self-Care Comments    Other Self-Care Comments   tried serola belt and educated how to get one; MET to correct anterior rotation of Rt pelvis andedu on how to perform at home      Neuro Re-ed    Neuro Re-ed Details   educated and performed breathing and TrA activation during all listed below      Exercises   Exercises  Lumbar      Lumbar Exercises: Stretches   Lower Trunk Rotation  2 reps;10 seconds   upper trunk rotation 5x each side     Lumbar Exercises: Supine   Ab Set  20 reps;3 seconds    Other Supine Lumbar Exercises  bent knee fall out - 5x with TrA  engaged      Lumbar Exercises: Sidelying   Other Sidelying Lumbar Exercises  positioning with pillow under waiste for reuded discomfort      Lumbar Exercises: Quadruped   Madcat/Old Horse  10 reps    Other Quadruped Lumbar Exercises  childs pose - 5 x             PT Education - 03/27/19 0930    Education Details  Access Code: [email or web address]  and SI loc       PT Short Term Goals - 03/27/19 0911      PT SHORT TERM GOAL #1   Title  ind with initial HEP    Baseline  not consistent in doing it but is able to do successfully    Status  Achieved        PT Long Term Goals - 03/20/19 1649      PT LONG TERM GOAL #1   Title  ind with advanced HEP to maintain good mechanics with functional activities    Time  8    Period  Weeks    Status  New    Target Date  05/14/19      PT LONG TERM GOAL #2   Title  pt will report 2/3 Marinoff scale and understand how to progress towards less painful intercourse    Baseline  3/3    Time  8    Period  Weeks    Status  New    Target Date  05/14/19      PT LONG TERM GOAL #3   Title  Pt will demonstrate 1 finger width diastasis at the most    Baseline  2    Time  8    Period  Weeks    Status  New    Target Date  05/14/19      PT LONG TERM GOAL #4   Title  Pt will report she is able to ride her bike due to feeling more core strength and stability    Time  8    Period  Weeks    Status  New    Target Date  05/14/19      PT LONG TERM GOAL #5   Title  Pt will report overall 60% less pain in abdomen    Time  8    Period  Weeks    Status  New    Target Date  05/14/19            Plan - 03/27/19 0930    Clinical Impression Statement  Pt did well with posterior correction of Rt pelvic obliquity.  Pt was able to get TrA activated with a lot of verbal and tactile cues.  Pt tried Serola belt for pelvic stability and responded well to having it on.  She felt better after today's treatment and will benefit from skilled PT to progress  improved soft tissue length and strength for improved ability to perform functional activities.    PT Treatment/Interventions  ADLs/Self Care Home Management;Biofeedback;Cryotherapy;Electrical Stimulation;Moist Heat;Therapeutic activities;Therapeutic exercise;Neuromuscular re-education;Patient/family education;Manual techniques;Taping;Dry needling    PT Next Visit Plan  f/u on belt, pillow under waiste for sleep position, HEP; internal STM and scar massage, lumbar STM, continue gentle TrA and pelvic floor strength    PT Home Exercise Plan  Access Code: [email or web address]    Consulted and Agree with Plan of Care  Patient       Patient will benefit from skilled therapeutic intervention in order to improve the following deficits and impairments:  Pain, Postural dysfunction, Decreased strength, Decreased coordination, Increased muscle spasms, Impaired tone  Visit Diagnosis: 1. Muscle weakness (generalized)   2. Other muscle spasm   3. Unspecified lack of coordination        Problem List Patient Active Problem List   Diagnosis Date Noted  . Severe preeclampsia 12/10/2018  . Endometritis 12/10/2018  . SOB (shortness of breath) 10/25/2018  . Palpitations 10/25/2018    Jule Ser, PT 03/27/2019, 10:42 AM  [facility] [address], [place], [place], [address] Phone: [phone]   Fax:  [phone]  Name: Courtney Harmon MRN: [id number] Date of Birth: [birth year]

## 2019-04-03 ENCOUNTER — Ambulatory Visit: Payer: BLUE CROSS/BLUE SHIELD | Admitting: Physical Therapy

## 2019-04-03 ENCOUNTER — Encounter: Payer: Self-pay | Admitting: Physical Therapy

## 2019-04-03 ENCOUNTER — Other Ambulatory Visit: Payer: Self-pay

## 2019-04-03 DIAGNOSIS — F4323 Adjustment disorder with mixed anxiety and depressed mood: Secondary | ICD-10-CM | POA: Diagnosis not present

## 2019-04-03 DIAGNOSIS — M62838 Other muscle spasm: Secondary | ICD-10-CM

## 2019-04-03 DIAGNOSIS — M6281 Muscle weakness (generalized): Secondary | ICD-10-CM

## 2019-04-03 DIAGNOSIS — R279 Unspecified lack of coordination: Secondary | ICD-10-CM | POA: Diagnosis not present

## 2019-04-03 NOTE — Therapy (Signed)
[facility] [address] [place], [place], [address] Phone: [phone]   Fax:  [phone]  Physical Therapy Treatment  Patient Details  Name: Courtney Harmon MRN: [id number] Date of Birth: Dec 03, 1980 Referring Provider (PT): Verdene Rio Lara Mulch, CNM   Encounter Date: 04/03/2019  PT End of Session - 04/03/19 0900    Visit Number  3    Date for PT Re-Evaluation  05/14/19    PT Start Time  0851   pt arrived late   PT Stop Time  0929    PT Time Calculation (min)  38 min    Activity Tolerance  Patient tolerated treatment well    Behavior During Therapy  [facility] for tasks assessed/performed       Past Medical History:  Diagnosis Date  . Gestational diabetes    With first pregnancy    Past Surgical History:  Procedure Laterality Date  . NO PAST SURGERIES      There were no vitals filed for this visit.  Subjective Assessment - 04/03/19 0904    Subjective  I have been doing better with the exercise.  I am not sure if I am doing them correctly.    Patient Stated Goals  feel stronger, be able to walk and run after kids and ride bike    Currently in Pain?  No/denies                       [facility] Adult PT Treatment/Exercise - 04/03/19 0001      Neuro Re-ed    Neuro Re-ed Details   sitting on ball - breathing and lengthening pelvic floor, circles both ways, TrA engaged      Lumbar Exercises: Seated   Hip Flexion on Ball  Both;15 reps    Sit to Stand  10 reps      Lumbar Exercises: Supine   Ab Set  20 reps;3 seconds    Bridge with [employer]  15 reps      Lumbar Exercises: Sidelying   Clam  Both;10 reps               PT Short Term Goals - 03/27/19 0911      PT SHORT TERM GOAL #1   Title  ind with initial HEP    Baseline  not consistent in doing it but is able to do successfully    Status  Achieved        PT Long Term Goals - 03/20/19 1649      PT LONG TERM GOAL #1    Title  ind with advanced HEP to maintain good mechanics with functional activities    Time  8    Period  Weeks    Status  New    Target Date  05/14/19      PT LONG TERM GOAL #2   Title  pt will report 2/3 Marinoff scale and understand how to progress towards less painful intercourse    Baseline  3/3    Time  8    Period  Weeks    Status  New    Target Date  05/14/19      PT LONG TERM GOAL #3   Title  Pt will demonstrate 1 finger width diastasis at the most    Baseline  2    Time  8    Period  Weeks    Status  New    Target Date  05/14/19  PT LONG TERM GOAL #4   Title  Pt will report she is able to ride her bike due to feeling more core strength and stability    Time  8    Period  Weeks    Status  New    Target Date  05/14/19      PT LONG TERM GOAL #5   Title  Pt will report overall 60% less pain in abdomen    Time  8    Period  Weeks    Status  New    Target Date  05/14/19            Plan - 04/03/19 7824    Clinical Impression Statement  Pt was able to engage TrA correctly today. She was educated in variety of positions to engage TrA in order to build up core strength for improved function with lifting her daughter and walking/running after her kids.  she will conitnue to benefit from skilled PT to work towards greater core strength and to work on improved soft tissue length to return to all functional acivities.    PT Treatment/Interventions  ADLs/Self Care Home Management;Biofeedback;Cryotherapy;Electrical Stimulation;Moist Heat;Therapeutic activities;Therapeutic exercise;Neuromuscular re-education;Patient/family education;Manual techniques;Taping;Dry needling    PT Next Visit Plan  internal STM and scar massage, lumbar STM, continue TrA and pelvic floor strength    PT Home Exercise Plan  Access Code: [email or web address]    Consulted and Agree with Plan of Care  Patient       Patient will benefit from skilled therapeutic intervention in order to improve the following  deficits and impairments:  Pain, Postural dysfunction, Decreased strength, Decreased coordination, Increased muscle spasms, Impaired tone  Visit Diagnosis: 1. Muscle weakness (generalized)   2. Other muscle spasm   3. Unspecified lack of coordination        Problem List Patient Active Problem List   Diagnosis Date Noted  . Severe preeclampsia 12/10/2018  . Endometritis 12/10/2018  . SOB (shortness of breath) 10/25/2018  . Palpitations 10/25/2018    Jule Ser, PT 04/03/2019, 9:32 AM   [facility] [address], [place], [place], [address] Phone: [phone]   Fax:  [phone]  Name: Courtney Harmon MRN: [id number] Date of Birth: 13-Jun-1981

## 2019-04-09 ENCOUNTER — Other Ambulatory Visit: Payer: Self-pay

## 2019-04-09 DIAGNOSIS — Z20822 Contact with and (suspected) exposure to covid-19: Secondary | ICD-10-CM

## 2019-04-09 DIAGNOSIS — R6889 Other general symptoms and signs: Secondary | ICD-10-CM | POA: Diagnosis not present

## 2019-04-10 ENCOUNTER — Ambulatory Visit: Payer: BLUE CROSS/BLUE SHIELD | Admitting: Physical Therapy

## 2019-04-10 LAB — SPECIMEN STATUS REPORT

## 2019-04-10 LAB — NOVEL CORONAVIRUS, NAA: SARS-CoV-2, NAA: NOT DETECTED

## 2019-04-17 ENCOUNTER — Ambulatory Visit: Payer: BLUE CROSS/BLUE SHIELD | Attending: Certified Nurse Midwife | Admitting: Physical Therapy

## 2019-04-17 ENCOUNTER — Encounter: Payer: Self-pay | Admitting: Physical Therapy

## 2019-04-17 ENCOUNTER — Other Ambulatory Visit: Payer: Self-pay

## 2019-04-17 DIAGNOSIS — M6281 Muscle weakness (generalized): Secondary | ICD-10-CM | POA: Diagnosis not present

## 2019-04-17 DIAGNOSIS — R279 Unspecified lack of coordination: Secondary | ICD-10-CM | POA: Insufficient documentation

## 2019-04-17 DIAGNOSIS — M62838 Other muscle spasm: Secondary | ICD-10-CM | POA: Diagnosis not present

## 2019-04-17 NOTE — Therapy (Signed)
[facility] [address], [place], [place], [address] Phone: [phone]   Fax:  [phone]  Physical Therapy Treatment  Patient Details  Name: Courtney Harmon MRN: [id number] Date of Birth: November 22, 1980 Referring Provider (PT): Verdene Rio Lara Mulch, CNM   Encounter Date: 04/17/2019  PT End of Session - 04/17/19 0906    Visit Number  4    Date for PT Re-Evaluation  05/14/19    PT Start Time  1031    PT Stop Time  0928    PT Time Calculation (min)  41 min    Activity Tolerance  Patient tolerated treatment well    Behavior During Therapy  [facility] for tasks assessed/performed       Past Medical History:  Diagnosis Date  . Gestational diabetes    With first pregnancy    Past Surgical History:  Procedure Laterality Date  . NO PAST SURGERIES      There were no vitals filed for this visit.  Subjective Assessment - 04/17/19 0853    Subjective  I am feeling a lot better.  The breathing is helping.  I still feel stiff getting out of bed and some pain getting out of bed    Currently in Pain?  No/denies                       [facility] Adult PT Treatment/Exercise - 04/17/19 0001      Self-Care   Other Self-Care Comments   educated on self massage      Neuro Re-ed    Neuro Re-ed Details   sitting on ball - breathing and lengthening pelvic floor, circles both ways, TrA engaged      Lumbar Exercises: Seated   Hip Flexion on Ball  Both;15 reps      Lumbar Exercises: Quadruped   Other Quadruped Lumbar Exercises  rocking with TrA engaged      Manual Therapy   Manual Therapy  Internal Pelvic Floor;Muscle Energy Technique    Manual therapy comments  pt identity confirmed and informed consent given to perform internal soft tissue     Internal Pelvic Floor  pubococcygeus, bulbocavernosis, fascial release and scar tissue massage    Muscle Energy Technique  to rotate Rt hip more posterior - 3 bouts of 20  sec, hip abd/adduction 10x               PT Short Term Goals - 03/27/19 0911      PT SHORT TERM GOAL #1   Title  ind with initial HEP    Baseline  not consistent in doing it but is able to do successfully    Status  Achieved        PT Long Term Goals - 04/17/19 1103      PT LONG TERM GOAL #1   Title  ind with advanced HEP to maintain good mechanics with functional activities    Baseline  still working on posture    Status  On-going      PT LONG TERM GOAL #2   Title  pt will report 2/3 Marinoff scale and understand how to progress towards less painful intercourse    Baseline  has had but still painful    Status  On-going      PT LONG TERM GOAL #3   Title  Pt will demonstrate 1 finger width diastasis at the most    Status  On-going  Plan - 04/17/19 1043    Clinical Impression Statement  Pt is doing well with her HEP and feeling some easing of her symptoms.  She continues to have some difficutly with maintaining neutral pelvic alignemnt, but responded well to rotation using MET for anterior pelvic rotation.  Pt has very tight fascial restrictions in posterior vaginal canal where she has scar tissue.  Pt was educated on doing self massage.  She will benefit from skilled PT to continue to progress core strength for maintained neutral pelvic alignment    PT Treatment/Interventions  ADLs/Self Care Home Management;Biofeedback;Cryotherapy;Electrical Stimulation;Moist Heat;Therapeutic activities;Therapeutic exercise;Neuromuscular re-education;Patient/family education;Manual techniques;Taping;Dry needling    PT Next Visit Plan  f/u on internal STM and scar massage, lumbar STM, continue TrA and pelvic floor strength    PT Home Exercise Plan  Access Code: [email or web address]    Consulted and Agree with Plan of Care  Patient       Patient will benefit from skilled therapeutic intervention in order to improve the following deficits and impairments:  Pain, Postural dysfunction,  Decreased strength, Decreased coordination, Increased muscle spasms, Impaired tone  Visit Diagnosis: Muscle weakness (generalized)  Other muscle spasm  Unspecified lack of coordination     Problem List Patient Active Problem List   Diagnosis Date Noted  . Severe preeclampsia 12/10/2018  . Endometritis 12/10/2018  . SOB (shortness of breath) 10/25/2018  . Palpitations 10/25/2018    Jule Ser, PT 04/17/2019, 11:05 AM  [facility] [address] [place], [place], [address] Phone: [phone]   Fax:  [phone]  Name: Courtney Harmon MRN: [id number] Date of Birth: June 21, 1981

## 2019-04-17 NOTE — Patient Instructions (Signed)
STRETCHING THE PELVIC FLOOR MUSCLES NO DILATOR  Supplies . Vaginal lubricant . Mirror (optional) . Gloves (optional) Positioning . Start in a semi-reclined position with your head propped up. Bend your knees and place your thumb or finger at the vaginal opening. Procedure . Apply a moderate amount of lubricant on the outer skin of your vagina, the labia minora.  Apply additional lubricant to your finger. . Spread the skin away from the vaginal opening. Place the end of your finger at the opening. . Do a maximum contraction of the pelvic floor muscles. Tighten the vagina and the anus maximally and relax. . When you know they are relaxed, gently and slowly insert your finger into your vagina, directing your finger slightly downward, for 2-3 inches of insertion. . Relax and stretch the 6 o'clock position . Hold each stretch for _2 min__ and repeat __1_ time with rest breaks of _1__ seconds between each stretch. . Repeat the stretching in the 4 o'clock and 8 o'clock positions. . Total time should be _6__ minutes, _1__ x per day.  Note the amount of theme your were able to achieve and your tolerance to your finger in your vagina. . Once you have accomplished the techniques you may try them in standing with one foot resting on the tub, or in other positions.  This is a good stretch to do in the shower if you don't need to use lubricant.   [facility] [address], [address] [place], [place] [address] Phone # [phone] Fax [phone]  

## 2019-04-24 ENCOUNTER — Encounter: Payer: BLUE CROSS/BLUE SHIELD | Admitting: Physical Therapy

## 2019-04-30 DIAGNOSIS — E663 Overweight: Secondary | ICD-10-CM | POA: Diagnosis not present

## 2019-04-30 DIAGNOSIS — Z8759 Personal history of other complications of pregnancy, childbirth and the puerperium: Secondary | ICD-10-CM | POA: Diagnosis not present

## 2019-05-01 ENCOUNTER — Other Ambulatory Visit: Payer: Self-pay

## 2019-05-01 ENCOUNTER — Encounter: Payer: Self-pay | Admitting: Physical Therapy

## 2019-05-01 ENCOUNTER — Ambulatory Visit: Payer: BLUE CROSS/BLUE SHIELD | Admitting: Physical Therapy

## 2019-05-01 DIAGNOSIS — M62838 Other muscle spasm: Secondary | ICD-10-CM | POA: Diagnosis not present

## 2019-05-01 DIAGNOSIS — R279 Unspecified lack of coordination: Secondary | ICD-10-CM

## 2019-05-01 DIAGNOSIS — M6281 Muscle weakness (generalized): Secondary | ICD-10-CM

## 2019-05-01 NOTE — Therapy (Signed)
[facility] [address] [place], [place], [address] Phone: [phone]   Fax:  [phone]  Physical Therapy Treatment  Patient Details  Name: Courtney Harmon MRN: [id number] Date of Birth: [birth year] Referring Provider (PT): Verdene Rio Lara Mulch, CNM   Encounter Date: 05/01/2019  PT End of Session - 05/01/19 0905    Visit Number  5    Date for PT Re-Evaluation  05/14/19    PT Start Time  0900   arrived late   PT Stop Time  0928    PT Time Calculation (min)  28 min    Activity Tolerance  Patient tolerated treatment well    Behavior During Therapy  [facility] for tasks assessed/performed       Past Medical History:  Diagnosis Date  . Gestational diabetes    With first pregnancy    Past Surgical History:  Procedure Laterality Date  . NO PAST SURGERIES      There were no vitals filed for this visit.  Subjective Assessment - 05/01/19 0926    Subjective  Pt reports feeling a lot better and has been doing soft tissue massage to the scar tissue.  States it hurts initially but then feels better after loosening    Patient Stated Goals  feel stronger, be able to walk and run after kids and ride bike    Currently in Pain?  No/denies                       [facility] Adult PT Treatment/Exercise - 05/01/19 0001      Lumbar Exercises: Aerobic   Elliptical  L1 2x2 fwd/back      Lumbar Exercises: Seated   Other Seated Lumbar Exercises  flexion and rotation stretch - 3 x 20 sec      Manual Therapy   Manual Therapy  Soft tissue mobilization    Soft tissue mobilization  lumbar and thoracic paraspinals               PT Short Term Goals - 03/27/19 0911      PT SHORT TERM GOAL #1   Title  ind with initial HEP    Baseline  not consistent in doing it but is able to do successfully    Status  Achieved        PT Long Term Goals - 04/17/19 1103      PT LONG TERM GOAL #1   Title  ind with  advanced HEP to maintain good mechanics with functional activities    Baseline  still working on posture    Status  On-going      PT LONG TERM GOAL #2   Title  pt will report 2/3 Marinoff scale and understand how to progress towards less painful intercourse    Baseline  has had but still painful    Status  On-going      PT LONG TERM GOAL #3   Title  Pt will demonstrate 1 finger width diastasis at the most    Status  On-going            Plan - 05/01/19 1000    Clinical Impression Statement  Pt arrived late today. She was very tight Rt>Lt along lumbar and thoracic paraspinals.  Pt had palpable difference in soft tissue after soft tissue mobs.  Pt did well with stretch to maintain improved soft tissue length.  HEP was reviewed during STM and she will contiue to work  on core strength and self massage.  Pt will benefit from skilled PT to porogress core strength.    PT Treatment/Interventions  ADLs/Self Care Home Management;Biofeedback;Cryotherapy;Electrical Stimulation;Moist Heat;Therapeutic activities;Therapeutic exercise;Neuromuscular re-education;Patient/family education;Manual techniques;Taping;Dry needling    PT Next Visit Plan  internal STM and core strenght    PT Home Exercise Plan  Access Code: [email or web address]    Recommended Other Services  intial cert signed    Consulted and Agree with Plan of Care  Patient       Patient will benefit from skilled therapeutic intervention in order to improve the following deficits and impairments:  Pain, Postural dysfunction, Decreased strength, Decreased coordination, Increased muscle spasms, Impaired tone  Visit Diagnosis: Muscle weakness (generalized)  Other muscle spasm  Unspecified lack of coordination     Problem List Patient Active Problem List   Diagnosis Date Noted  . Severe preeclampsia 12/10/2018  . Endometritis 12/10/2018  . SOB (shortness of breath) 10/25/2018  . Palpitations 10/25/2018    Camillo Flaming Desenglau,PT 05/01/2019,  10:09 AM  [facility] [address], [place], [place], [address] Phone: [phone]   Fax:  [phone]  Name: Courtney Harmon MRN: [id number] Date of Birth: [birth year]

## 2019-05-08 ENCOUNTER — Ambulatory Visit: Payer: BLUE CROSS/BLUE SHIELD | Admitting: Physical Therapy

## 2019-05-15 ENCOUNTER — Ambulatory Visit: Payer: BLUE CROSS/BLUE SHIELD | Admitting: Physical Therapy

## 2019-05-16 DIAGNOSIS — R030 Elevated blood-pressure reading, without diagnosis of hypertension: Secondary | ICD-10-CM | POA: Diagnosis not present

## 2019-05-16 DIAGNOSIS — E663 Overweight: Secondary | ICD-10-CM | POA: Diagnosis not present

## 2019-05-16 DIAGNOSIS — O1495 Unspecified pre-eclampsia, complicating the puerperium: Secondary | ICD-10-CM | POA: Diagnosis not present

## 2019-05-21 ENCOUNTER — Encounter: Payer: Self-pay | Admitting: Physical Therapy

## 2019-05-21 ENCOUNTER — Ambulatory Visit: Payer: BLUE CROSS/BLUE SHIELD | Attending: Certified Nurse Midwife | Admitting: Physical Therapy

## 2019-05-21 ENCOUNTER — Other Ambulatory Visit: Payer: Self-pay

## 2019-05-21 DIAGNOSIS — M62838 Other muscle spasm: Secondary | ICD-10-CM

## 2019-05-21 DIAGNOSIS — M6281 Muscle weakness (generalized): Secondary | ICD-10-CM | POA: Insufficient documentation

## 2019-05-21 DIAGNOSIS — R279 Unspecified lack of coordination: Secondary | ICD-10-CM

## 2019-05-21 NOTE — Therapy (Signed)
[facility] [address], [place], [place], [address] Phone: [phone]   Fax:  [phone]  Physical Therapy Treatment  Patient Details  Name: Courtney Harmon MRN: [id number] Date of Birth: 1981/03/13 Referring Provider (PT): Verdene Rio Lara Mulch, CNM   Encounter Date: 05/21/2019  PT End of Session - 05/21/19 1405    Visit Number  6    Date for PT Re-Evaluation  07/16/19    PT Start Time  1224    PT Stop Time  1445    PT Time Calculation (min)  40 min    Activity Tolerance  Patient tolerated treatment well    Behavior During Therapy  [facility] for tasks assessed/performed       Past Medical History:  Diagnosis Date  . Gestational diabetes    With first pregnancy    Past Surgical History:  Procedure Laterality Date  . NO PAST SURGERIES      There were no vitals filed for this visit.  Subjective Assessment - 05/21/19 1408    Subjective  I have been doing good.    Patient Stated Goals  feel stronger, be able to walk and run after kids and ride bike         [facility] PT Assessment - 05/21/19 0001      Assessment   Medical Diagnosis  R10.2 (ICD-10-CM) - Pelvic pain; R20.0 (ICD-10-CM) - Bilateral leg numbness; M62.08 (ICD-10-CM) - Diastasis recti    Referring Provider (PT)  Diona Fanti, CNM      Palpation   Palpation comment  diastasis rectus 1 finger width                   [facility] Adult PT Treatment/Exercise - 05/21/19 0001      Lumbar Exercises: Standing   Wall Slides Limitations  mat and wall push ups - 20x    Other Standing Lumbar Exercises  kneel and half kneel - 20x weighted  ball diagonals and flexion    Other Standing Lumbar Exercises  standing on foam roll with weighted ball      Lumbar Exercises: Supine   Bent Knee Raise Limitations  raise from mat - 20x; tap down 10x TrA activated       shoulder ext red and yellow with TrA activation         PT Short  Term Goals - 03/27/19 0911      PT SHORT TERM GOAL #1   Title  ind with initial HEP    Baseline  not consistent in doing it but is able to do successfully    Status  Achieved        PT Long Term Goals - 05/21/19 1411      PT LONG TERM GOAL #1   Title  ind with advanced HEP to maintain good mechanics with functional activities    Status  Achieved      PT LONG TERM GOAL #2   Title  pt will report 2/3 Marinoff scale and understand how to progress towards less painful intercourse    Baseline  still had pain but able to complete - 1-2/3    Status  Achieved      PT LONG TERM GOAL #3   Title  Pt will demonstrate 1 finger width diastasis at the most    Baseline  1 finger width    Status  Achieved      PT LONG TERM GOAL #4   Title  Pt  will report she is able to ride her bike due to feeling more core strength and stability    Baseline  I am now able to do my spin classes    Status  Achieved      PT LONG TERM GOAL #5   Title  Pt will report overall 60% less pain in abdomen    Baseline  no more pain    Status  Achieved            Plan - 05/21/19 1451    Clinical Impression Statement  Pt has met all of her goals.  She is doing well with strengthening and is independent with HEP at this time.  She is expected to be able to progress herself in a strengthening routine with the foundational exercises she has received at her skilled PT sessions.    PT Treatment/Interventions  ADLs/Self Care Home Management;Biofeedback;Cryotherapy;Electrical Stimulation;Moist Heat;Therapeutic activities;Therapeutic exercise;Neuromuscular re-education;Patient/family education;Manual techniques;Taping;Dry needling    PT Next Visit Plan  discharged today    PT Home Exercise Plan  Access Code: [email or web address]    Consulted and Agree with Plan of Care  Patient       Patient will benefit from skilled therapeutic intervention in order to improve the following deficits and impairments:  Pain, Postural dysfunction,  Decreased strength, Decreased coordination, Increased muscle spasms, Impaired tone  Visit Diagnosis: Muscle weakness (generalized)  Other muscle spasm  Unspecified lack of coordination     Problem List Patient Active Problem List   Diagnosis Date Noted  . Severe preeclampsia 12/10/2018  . Endometritis 12/10/2018  . SOB (shortness of breath) 10/25/2018  . Palpitations 10/25/2018    Harmon Flaming Courtney Lea,PT 05/21/2019, 3:01 PM  [facility] [address], [place], [place], [address] Phone: [phone]   Fax:  [phone]  Name: Courtney Harmon MRN: [id number] Date of Birth: September 09, 1980  PHYSICAL THERAPY DISCHARGE SUMMARY  Visits from Start of Care: 6  Current functional level related to goals / functional outcomes: See above   Remaining deficits: See above   Education / Equipment: HEP  Plan: Patient agrees to discharge.  Patient goals were met. Patient is being discharged due to meeting the stated rehab goals.  ?????   [employer], PT 05/21/19 3:03 PM

## 2019-07-25 ENCOUNTER — Other Ambulatory Visit: Payer: Self-pay

## 2019-07-25 DIAGNOSIS — Z20822 Contact with and (suspected) exposure to covid-19: Secondary | ICD-10-CM

## 2019-07-27 LAB — NOVEL CORONAVIRUS, NAA: SARS-CoV-2, NAA: NOT DETECTED

## 2019-08-14 DIAGNOSIS — H5213 Myopia, bilateral: Secondary | ICD-10-CM | POA: Diagnosis not present

## 2019-08-14 DIAGNOSIS — H52222 Regular astigmatism, left eye: Secondary | ICD-10-CM | POA: Diagnosis not present

## 2019-09-10 ENCOUNTER — Encounter: Payer: BLUE CROSS/BLUE SHIELD | Admitting: Certified Nurse Midwife

## 2019-10-01 ENCOUNTER — Encounter: Payer: Self-pay | Admitting: Certified Nurse Midwife

## 2019-10-01 DIAGNOSIS — R102 Pelvic and perineal pain: Secondary | ICD-10-CM | POA: Diagnosis not present

## 2019-10-22 DIAGNOSIS — Z6831 Body mass index (BMI) 31.0-31.9, adult: Secondary | ICD-10-CM | POA: Diagnosis not present

## 2019-10-22 DIAGNOSIS — Z01419 Encounter for gynecological examination (general) (routine) without abnormal findings: Secondary | ICD-10-CM | POA: Diagnosis not present

## 2019-10-22 DIAGNOSIS — Z1151 Encounter for screening for human papillomavirus (HPV): Secondary | ICD-10-CM | POA: Diagnosis not present

## 2019-10-24 DIAGNOSIS — Z131 Encounter for screening for diabetes mellitus: Secondary | ICD-10-CM | POA: Diagnosis not present

## 2019-10-24 DIAGNOSIS — Z1321 Encounter for screening for nutritional disorder: Secondary | ICD-10-CM | POA: Diagnosis not present

## 2019-10-24 DIAGNOSIS — Z13220 Encounter for screening for lipoid disorders: Secondary | ICD-10-CM | POA: Diagnosis not present

## 2019-10-24 DIAGNOSIS — Z1329 Encounter for screening for other suspected endocrine disorder: Secondary | ICD-10-CM | POA: Diagnosis not present

## 2019-11-21 DIAGNOSIS — R1084 Generalized abdominal pain: Secondary | ICD-10-CM | POA: Diagnosis not present

## 2019-11-22 DIAGNOSIS — R1084 Generalized abdominal pain: Secondary | ICD-10-CM | POA: Diagnosis not present

## 2019-11-26 DIAGNOSIS — F419 Anxiety disorder, unspecified: Secondary | ICD-10-CM | POA: Diagnosis not present

## 2019-12-25 DIAGNOSIS — Z0000 Encounter for general adult medical examination without abnormal findings: Secondary | ICD-10-CM | POA: Diagnosis not present

## 2019-12-25 DIAGNOSIS — Z13220 Encounter for screening for lipoid disorders: Secondary | ICD-10-CM | POA: Diagnosis not present

## 2020-03-10 DIAGNOSIS — E559 Vitamin D deficiency, unspecified: Secondary | ICD-10-CM | POA: Diagnosis not present

## 2020-03-11 ENCOUNTER — Ambulatory Visit
Admission: RE | Admit: 2020-03-11 | Discharge: 2020-03-11 | Disposition: A | Discharge status: Home / Self Care | Payer: BLUE CROSS/BLUE SHIELD | Source: Ambulatory Visit | Attending: Internal Medicine | Admitting: Internal Medicine

## 2020-03-11 ENCOUNTER — Other Ambulatory Visit: Payer: Self-pay | Admitting: Internal Medicine

## 2020-03-11 DIAGNOSIS — R059 Cough, unspecified: Secondary | ICD-10-CM

## 2020-03-11 DIAGNOSIS — R05 Cough: Secondary | ICD-10-CM | POA: Diagnosis not present

## 2020-04-25 ENCOUNTER — Encounter: Payer: Self-pay | Admitting: Allergy

## 2020-04-25 ENCOUNTER — Ambulatory Visit: Payer: BLUE CROSS/BLUE SHIELD | Admitting: Allergy

## 2020-04-25 ENCOUNTER — Other Ambulatory Visit: Payer: Self-pay

## 2020-04-25 VITALS — BP 120/72 | HR 70 | Temp 97.6°F | Resp 16 | Ht 62.0 in | Wt 172.4 lb

## 2020-04-25 DIAGNOSIS — R05 Cough: Secondary | ICD-10-CM

## 2020-04-25 DIAGNOSIS — R058 Other specified cough: Secondary | ICD-10-CM

## 2020-04-25 NOTE — Progress Notes (Signed)
New Patient Note  RE: Courtney Harmon MRN: [id number] DOB: 07/17/1981 Date of Office Visit: 04/25/2020  Referring provider: No ref. provider found Primary care provider: Seward Carol, MD       Chief Complaint: allergies, cough  History of present illness: Courtney Harmon is a 39 y.o. female presenting today for consultation for allergies.   She states she was having a lot of coughing at night time for years however this spring she started having more coughing in the daytime too. She at first thought the nighttime cough was attributed to being hot so she would make the air cooler at home.  She does report the nighttime cough did seem to occur more in summer months.  However with the cough more this spring she saw her PCP who thought the cough was related to allergies.  He recommended she take zyrtec.  When she was taking the zyrtec she states the cough was gone.  She did stop zyrtec over the past month but hasn't noticed the cough as bad as before taking zyrtec.   She denies any wheezing, shortness of breath or chest tightness.  The cough occurs in other environments too outside the home.  She denies any nasal congestion/drainage or throat clearing.  No ocular symptoms or skin symptoms.  No history of asthma.    Review of systems: Review of Systems  Constitutional: Negative.   HENT: Negative.   Eyes: Negative.   Respiratory: Positive for cough.   Cardiovascular: Negative.   Gastrointestinal: Negative.   Musculoskeletal: Negative.   Skin: Negative.   Neurological: Negative.     All other systems negative unless noted above in HPI  Past medical history: Past Medical History:  Diagnosis Date  . Eczema   . Gestational diabetes    With first pregnancy    Past surgical history: Past Surgical History:  Procedure Laterality Date  . NO PAST SURGERIES      Family history:  Family History  Problem Relation Age of Onset  . Hyperlipidemia Mother   . Breast cancer  Maternal Grandmother   . Ovarian cancer Neg Hx   . Allergic rhinitis Neg Hx   . Angioedema Neg Hx   . Asthma Neg Hx   . Eczema Neg Hx   . Immunodeficiency Neg Hx   . Urticaria Neg Hx     Social history: Lives in a home with carpeting in bedroom with gas heating and central cooling.  No pets in the home.  No concern for water damage, mildew or roaches in the home.  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  Denies smoking history.   Medication List: Current Outpatient Medications  Medication Sig Dispense Refill  . busPIRone (BUSPAR) 5 MG tablet Take 5 mg by mouth daily as needed.    Marland Kitchen FLUoxetine (PROZAC) 20 MG tablet Take 20 mg by mouth daily.     No current facility-administered medications for this visit.    Known medication allergies: No Known Allergies   Physical examination: Blood pressure 120/72, pulse 70, temperature 97.6 F (36.4 C), temperature source Oral, resp. rate 16, height 5\' 2"  (1.575 m), weight 172 lb 6.4 oz (78.2 kg), SpO2 97 %.  General: Alert, interactive, in no acute distress. HEENT: PERRLA, TMs pearly gray, turbinates non-edematous without discharge, post-pharynx non erythematous. Neck: Supple without lymphadenopathy. Lungs: Clear to auscultation without wheezing, rhonchi or rales. {no increased work of breathing. CV: Normal S1, S2 without murmurs. Abdomen: Nondistended, nontender. Skin: Warm and dry, without lesions or  rashes. Extremities:  No clubbing, cyanosis or edema. Neuro:   Grossly intact.  Diagnositics/Labs:  Spirometry: FEV1: 2.85L 121%, FVC: 3.48L 123%, ratio consistent with nonobstructive pattern  Allergy testing: environmental allergy skin prick testing is positive to dust mite (DP).  Intradermal testing is positive to mold mix 2 and cockroach.   Allergy testing results were read and interpreted by provider, documented by clinical staff.   Assessment and plan: Allergic cough    - lung function testing is normal!  - allergy testing today is  positive to dust mites, indoor molds and cockroach  - allergen avoidance measures discussed/handouts provided  - resume Zyrtec 10mg  daily use.   If becomes less effective other long-antihistamines include Allegra 180mg  or Xyzal 5mg  (both over-the-counter)  - let us know if antihistamine if not effective.  Next step would be to add in Singulair medication  - allergen immunotherapy discussed today including protocol, benefits and risk.  Informational handout provided.  If interested in this therapuetic option you can check with your insurance carrier for coverage.  Let us know if you would like to proceed with this option.    Follow-up 4-6 months or sooner if needed  I appreciate the opportunity to take part in Courtney Harmon's care. Please do not hesitate to contact me with questions.  Sincerely,   Prudy Feeler, MD Allergy/Immunology [facility]

## 2020-04-25 NOTE — Patient Instructions (Addendum)
 -   lung function testing is normal!  - allergy testing today is positive to dust mites, indoor molds and cockroach  - allergen avoidance measures discussed/handouts provided  - resume Zyrtec 10mg  daily use.   If becomes less effective other long-antihistamines include Allegra 180mg  or Xyzal 5mg  (both over-the-counter)  - let us know if antihistamine if not effective.  Next step would be to add in Singulair medication  - allergen immunotherapy discussed today including protocol, benefits and risk.  Informational handout provided.  If interested in this therapuetic option you can check with your insurance carrier for coverage.  Let us know if you would like to proceed with this option.    Follow-up 4-6 months or sooner if needed

## 2020-05-28 DIAGNOSIS — Z23 Encounter for immunization: Secondary | ICD-10-CM | POA: Diagnosis not present

## 2020-06-20 DIAGNOSIS — Z713 Dietary counseling and surveillance: Secondary | ICD-10-CM | POA: Diagnosis not present

## 2020-06-26 DIAGNOSIS — H16223 Keratoconjunctivitis sicca, not specified as Sjogren's, bilateral: Secondary | ICD-10-CM | POA: Diagnosis not present

## 2020-06-27 DIAGNOSIS — Z0184 Encounter for antibody response examination: Secondary | ICD-10-CM | POA: Diagnosis not present

## 2020-07-08 DIAGNOSIS — Z713 Dietary counseling and surveillance: Secondary | ICD-10-CM | POA: Diagnosis not present

## 2020-07-31 DIAGNOSIS — Z713 Dietary counseling and surveillance: Secondary | ICD-10-CM | POA: Diagnosis not present

## 2020-09-03 DIAGNOSIS — H5213 Myopia, bilateral: Secondary | ICD-10-CM | POA: Diagnosis not present

## 2020-09-03 DIAGNOSIS — H52222 Regular astigmatism, left eye: Secondary | ICD-10-CM | POA: Diagnosis not present

## 2020-09-04 DIAGNOSIS — R202 Paresthesia of skin: Secondary | ICD-10-CM | POA: Diagnosis not present

## 2020-09-19 DIAGNOSIS — Z713 Dietary counseling and surveillance: Secondary | ICD-10-CM | POA: Diagnosis not present

## 2020-10-17 DIAGNOSIS — Z713 Dietary counseling and surveillance: Secondary | ICD-10-CM | POA: Diagnosis not present

## 2020-10-21 DIAGNOSIS — G5603 Carpal tunnel syndrome, bilateral upper limbs: Secondary | ICD-10-CM | POA: Diagnosis not present

## 2020-12-12 DIAGNOSIS — G5603 Carpal tunnel syndrome, bilateral upper limbs: Secondary | ICD-10-CM | POA: Diagnosis not present

## 2021-01-15 DIAGNOSIS — E559 Vitamin D deficiency, unspecified: Secondary | ICD-10-CM | POA: Diagnosis not present

## 2021-01-15 DIAGNOSIS — Z13220 Encounter for screening for lipoid disorders: Secondary | ICD-10-CM | POA: Diagnosis not present

## 2021-01-15 DIAGNOSIS — Z0000 Encounter for general adult medical examination without abnormal findings: Secondary | ICD-10-CM | POA: Diagnosis not present

## 2021-03-17 DIAGNOSIS — L821 Other seborrheic keratosis: Secondary | ICD-10-CM | POA: Diagnosis not present

## 2021-03-17 DIAGNOSIS — L988 Other specified disorders of the skin and subcutaneous tissue: Secondary | ICD-10-CM | POA: Diagnosis not present

## 2021-03-17 DIAGNOSIS — L608 Other nail disorders: Secondary | ICD-10-CM | POA: Diagnosis not present

## 2021-03-21 DIAGNOSIS — U071 COVID-19: Secondary | ICD-10-CM | POA: Diagnosis not present

## 2021-03-21 DIAGNOSIS — R059 Cough, unspecified: Secondary | ICD-10-CM | POA: Diagnosis not present

## 2021-03-21 DIAGNOSIS — J069 Acute upper respiratory infection, unspecified: Secondary | ICD-10-CM | POA: Diagnosis not present

## 2021-06-05 DIAGNOSIS — F4323 Adjustment disorder with mixed anxiety and depressed mood: Secondary | ICD-10-CM | POA: Diagnosis not present

## 2021-06-26 DIAGNOSIS — F4323 Adjustment disorder with mixed anxiety and depressed mood: Secondary | ICD-10-CM | POA: Diagnosis not present

## 2021-07-01 DIAGNOSIS — Z6830 Body mass index (BMI) 30.0-30.9, adult: Secondary | ICD-10-CM | POA: Diagnosis not present

## 2021-07-01 DIAGNOSIS — Z1231 Encounter for screening mammogram for malignant neoplasm of breast: Secondary | ICD-10-CM | POA: Diagnosis not present

## 2021-07-01 DIAGNOSIS — Z01419 Encounter for gynecological examination (general) (routine) without abnormal findings: Secondary | ICD-10-CM | POA: Diagnosis not present

## 2021-07-17 DIAGNOSIS — F4323 Adjustment disorder with mixed anxiety and depressed mood: Secondary | ICD-10-CM | POA: Diagnosis not present

## 2021-08-03 DIAGNOSIS — Z1329 Encounter for screening for other suspected endocrine disorder: Secondary | ICD-10-CM | POA: Diagnosis not present

## 2021-08-03 DIAGNOSIS — Z13220 Encounter for screening for lipoid disorders: Secondary | ICD-10-CM | POA: Diagnosis not present

## 2021-08-03 DIAGNOSIS — Z0000 Encounter for general adult medical examination without abnormal findings: Secondary | ICD-10-CM | POA: Diagnosis not present

## 2021-08-03 DIAGNOSIS — Z131 Encounter for screening for diabetes mellitus: Secondary | ICD-10-CM | POA: Diagnosis not present

## 2021-08-13 DIAGNOSIS — E7800 Pure hypercholesterolemia, unspecified: Secondary | ICD-10-CM | POA: Diagnosis not present

## 2021-08-13 DIAGNOSIS — R7303 Prediabetes: Secondary | ICD-10-CM | POA: Diagnosis not present

## 2021-10-08 DIAGNOSIS — Z03818 Encounter for observation for suspected exposure to other biological agents ruled out: Secondary | ICD-10-CM | POA: Diagnosis not present

## 2021-10-08 DIAGNOSIS — J069 Acute upper respiratory infection, unspecified: Secondary | ICD-10-CM | POA: Diagnosis not present

## 2021-10-08 DIAGNOSIS — J028 Acute pharyngitis due to other specified organisms: Secondary | ICD-10-CM | POA: Diagnosis not present

## 2021-12-07 DIAGNOSIS — E663 Overweight: Secondary | ICD-10-CM | POA: Diagnosis not present

## 2021-12-07 DIAGNOSIS — R030 Elevated blood-pressure reading, without diagnosis of hypertension: Secondary | ICD-10-CM | POA: Diagnosis not present

## 2021-12-07 DIAGNOSIS — E7800 Pure hypercholesterolemia, unspecified: Secondary | ICD-10-CM | POA: Diagnosis not present

## 2021-12-07 DIAGNOSIS — R7303 Prediabetes: Secondary | ICD-10-CM | POA: Diagnosis not present

## 2021-12-13 ENCOUNTER — Emergency Department (HOSPITAL_COMMUNITY): Payer: BC Managed Care – PPO

## 2021-12-13 ENCOUNTER — Emergency Department (HOSPITAL_COMMUNITY)
Admission: EM | Admit: 2021-12-13 | Discharge: 2021-12-13 | Disposition: A | Discharge status: Home / Self Care | Payer: BC Managed Care – PPO | Attending: Emergency Medicine | Admitting: Emergency Medicine

## 2021-12-13 ENCOUNTER — Other Ambulatory Visit: Payer: Self-pay

## 2021-12-13 ENCOUNTER — Encounter (HOSPITAL_COMMUNITY): Payer: Self-pay | Admitting: Emergency Medicine

## 2021-12-13 DIAGNOSIS — R1084 Generalized abdominal pain: Secondary | ICD-10-CM | POA: Insufficient documentation

## 2021-12-13 DIAGNOSIS — R109 Unspecified abdominal pain: Secondary | ICD-10-CM | POA: Diagnosis not present

## 2021-12-13 DIAGNOSIS — D259 Leiomyoma of uterus, unspecified: Secondary | ICD-10-CM

## 2021-12-13 DIAGNOSIS — N9489 Other specified conditions associated with female genital organs and menstrual cycle: Secondary | ICD-10-CM | POA: Diagnosis not present

## 2021-12-13 DIAGNOSIS — R1033 Periumbilical pain: Secondary | ICD-10-CM | POA: Diagnosis not present

## 2021-12-13 LAB — COMPREHENSIVE METABOLIC PANEL
ALT: 18 U/L (ref 0–44)
AST: 19 U/L (ref 15–41)
Albumin: 3.7 g/dL (ref 3.5–5.0)
Alkaline Phosphatase: 52 U/L (ref 38–126)
Anion gap: 5 (ref 5–15)
BUN: 8 mg/dL (ref 6–20)
CO2: 26 mmol/L (ref 22–32)
Calcium: 9 mg/dL (ref 8.9–10.3)
Chloride: 106 mmol/L (ref 98–111)
Creatinine, Ser: 0.54 mg/dL (ref 0.44–1.00)
GFR, Estimated: >60 mL/min (ref >60)
Glucose, Bld: 84 mg/dL (ref 70–99)
Potassium: 3.9 mmol/L (ref 3.5–5.1)
Sodium: 137 mmol/L (ref 135–145)
Total Bilirubin: 1 mg/dL (ref 0.3–1.2)
Total Protein: 6.9 g/dL (ref 6.5–8.1)

## 2021-12-13 LAB — URINALYSIS, ROUTINE W REFLEX MICROSCOPIC
Bilirubin Urine: NEGATIVE
Glucose, UA: NEGATIVE mg/dL
Hgb urine dipstick: NEGATIVE
Ketones, ur: NEGATIVE mg/dL
Leukocytes,Ua: NEGATIVE
Nitrite: NEGATIVE
Protein, ur: NEGATIVE mg/dL
Specific Gravity, Urine: 1.02 (ref 1.005–1.030)
pH: 7 (ref 5.0–8.0)

## 2021-12-13 LAB — CBC
HCT: 37.2 % (ref 36.0–46.0)
Hemoglobin: 12 g/dL (ref 12.0–15.0)
MCH: 29.4 pg (ref 26.0–34.0)
MCHC: 32.3 g/dL (ref 30.0–36.0)
MCV: 91.2 fL (ref 80.0–100.0)
Platelets: 248 10*3/uL (ref 150–400)
RBC: 4.08 MIL/uL (ref 3.87–5.11)
RDW: 13.4 % (ref 11.5–15.5)
WBC: 7.1 10*3/uL (ref 4.0–10.5)
nRBC: 0 % (ref 0.0–0.2)

## 2021-12-13 LAB — PREGNANCY, URINE: Preg Test, Ur: NEGATIVE

## 2021-12-13 LAB — LIPASE, BLOOD: Lipase: 26 U/L (ref 11–51)

## 2021-12-13 MED ORDER — IOHEXOL 300 MG/ML  SOLN
100.0000 mL | Freq: Once | INTRAMUSCULAR | Status: AC | PRN
  Administered 2021-12-13: 100 mL via INTRAVENOUS

## 2021-12-13 NOTE — ED Triage Notes (Signed)
C/o mid abd pain since yesterday afternoon.  Denies nausea, vomiting, diarrhea, and urinary complaints.  Seen at PCP office this week and bilirubin was elevated.  Supposed to return to PCP for lab work on Monday. ?

## 2021-12-13 NOTE — ED Provider Notes (Signed)
?  Physical Exam  ?BP (!) 142/89   Pulse 81   Temp 98.9 ?F (37.2 ?C) (Oral)   Resp 16   LMP 11/14/2021 (Approximate)   SpO2 99%  ? ?Physical Exam ?Vitals and nursing note reviewed.  ?Constitutional:   ?   General: She is not in acute distress. ?   Appearance: She is well-developed.  ?HENT:  ?   Head: Normocephalic and atraumatic.  ?Eyes:  ?   Conjunctiva/sclera: Conjunctivae normal.  ?Cardiovascular:  ?   Rate and Rhythm: Normal rate and regular rhythm.  ?   Heart sounds: No murmur heard. ?Pulmonary:  ?   Effort: Pulmonary effort is normal. No respiratory distress.  ?   Breath sounds: Normal breath sounds.  ?Abdominal:  ?   Palpations: Abdomen is soft.  ?   Tenderness: There is abdominal tenderness in the periumbilical area.  ?Musculoskeletal:     ?   General: No swelling.  ?   Cervical back: Neck supple.  ?Skin: ?   General: Skin is warm and dry.  ?   Capillary Refill: Capillary refill takes less than 2 seconds.  ?Neurological:  ?   Mental Status: She is alert.  ?Psychiatric:     ?   Mood and Affect: Mood normal.  ? ? ?Procedures  ?Procedures ? ?ED Course / MDM  ? ?Clinical Course as of 12/13/21 2321  ?Sun Dec 13, 2021  ?[place] Pending CT, if neg then home [MK]  ?  ?Clinical Course User Index ?[MK] Bay Wayson, Debe Coder, MD  ? ?Medical Decision Making ?Amount and/or Complexity of Data Reviewed ?Labs: ordered. ?Radiology: ordered. ? ?Risk ?Prescription drug management. ? ? ?Patient received in handoff.  Abdominal pain pending CT.  CT reassuringly unremarkable outside of a fibroid uterus.  Suspect these enlarging fibroids may be contributing to patient's abdominal pain.  She was then discharged with outpatient OB/GYN follow-up. ? ? ? ? ?  ?Teressa Lower, MD ?12/13/21 2321 ? ?

## 2021-12-13 NOTE — ED Provider Notes (Signed)
?[facility] ?Provider Note ? ? ?CSN: [id number] ?Arrival date & time: 12/13/21  1107 ? ?  ? ?History ? ?Chief Complaint  ?Patient presents with  ? Abdominal Pain  ? ? ?Courtney Harmon is a 41 y.o. female.  Presented to emergency department due to concern for abdominal pain.  Reports abdominal pain has been going on since yesterday.  Upper to lower abdomen at times.  Initially right-sided in the middle.  Currently pain is actually improved.  Pain is worse with certain movements.  States that she was at her primary care office a week ago and her bilirubin was elevated, she is unsure what the level was.  States that she does not have any abdominal complaint at that time. ? ?HPI ? ?  ? ?Home Medications ?Prior to Admission medications   ?Medication Sig Start Date End Date Taking? Authorizing Provider  ?busPIRone (BUSPAR) 5 MG tablet Take 5 mg by mouth daily as needed.    [provider]  ?FLUoxetine (PROZAC) 20 MG tablet Take 20 mg by mouth daily.    [provider]  ?   ? ?Allergies    ?Patient has no known allergies.   ? ?Review of Systems   ?Review of Systems  ?Constitutional:  Negative for chills and fever.  ?HENT:  Negative for ear pain and sore throat.   ?Eyes:  Negative for pain and visual disturbance.  ?Respiratory:  Negative for cough and shortness of breath.   ?Cardiovascular:  Negative for chest pain and palpitations.  ?Gastrointestinal:  Positive for abdominal pain. Negative for nausea and vomiting.  ?Genitourinary:  Negative for dysuria and hematuria.  ?Musculoskeletal:  Negative for arthralgias and back pain.  ?Skin:  Negative for color change and rash.  ?Neurological:  Negative for seizures and syncope.  ?All other systems reviewed and are negative. ? ?Physical Exam ?Updated Vital Signs ?BP 125/85   Pulse 65   Temp 98.9 ?F (37.2 ?C) (Oral)   Resp 16   LMP 11/14/2021 (Approximate)   SpO2 99%  ?Physical Exam ?Vitals and nursing note reviewed.   ?Constitutional:   ?   General: She is not in acute distress. ?   Appearance: She is well-developed.  ?HENT:  ?   Head: Normocephalic and atraumatic.  ?Eyes:  ?   Conjunctiva/sclera: Conjunctivae normal.  ?Cardiovascular:  ?   Rate and Rhythm: Normal rate and regular rhythm.  ?   Heart sounds: No murmur heard. ?Pulmonary:  ?   Effort: Pulmonary effort is normal. No respiratory distress.  ?   Breath sounds: Normal breath sounds.  ?Abdominal:  ?   Palpations: Abdomen is soft.  ?   Tenderness: There is generalized abdominal tenderness. There is no guarding or rebound. Negative signs include Murphy's sign.  ?Musculoskeletal:     ?   General: No swelling.  ?   Cervical back: Neck supple.  ?Skin: ?   General: Skin is warm and dry.  ?   Capillary Refill: Capillary refill takes less than 2 seconds.  ?Neurological:  ?   Mental Status: She is alert.  ?Psychiatric:     ?   Mood and Affect: Mood normal.  ? ? ?ED Results / Procedures / Treatments   ?Labs ?(all labs ordered are listed, but only abnormal results are displayed) ?Labs Reviewed  ?URINALYSIS, ROUTINE W REFLEX MICROSCOPIC - Abnormal; Notable for the following components:  ?    Result Value  ? APPearance CLOUDY (*)   ? All  other components within normal limits  ?LIPASE, BLOOD  ?COMPREHENSIVE METABOLIC PANEL  ?CBC  ?I-STAT BETA HCG BLOOD, ED ([facility], [facility], [facility] ONLY)  ? ? ?EKG ?None ? ?Radiology ?No results found. ? ?Procedures ?Procedures  ? ? ?Medications Ordered in ED ?Medications - No data to display ? ?ED Course/ Medical Decision Making/ A&P ?  ?                        ?Medical Decision Making ?Amount and/or Complexity of Data Reviewed ?Labs: ordered. ?Radiology: ordered. ? ? ?41 year old lady presented to ER due to concern for abdominal pain.  On exam she appears well in no distress, she was noted to have generalized tenderness however.  CT abdomen pelvis was ordered to further evaluate.  I reviewed her basic laboratory work, no leukocytosis, no elevation in lipase.  No  UTI on UA. ? ?While awaiting CT scan, signed out to Dr. Tinnie Gens.  He will follow-up on CT results. ? ? ? ? ? ? ? ?Final Clinical Impression(s) / ED Diagnoses ?Final diagnoses:  ?Generalized abdominal pain  ? ? ?Rx / DC Orders ?ED Discharge Orders   ? ? None  ? ?  ? ? ?  ?Lucrezia Starch, MD ?12/13/21 1508 ? ?

## 2021-12-13 NOTE — ED Notes (Signed)
RN reviewed discharge instructions w/ pt. Follow up and pain management reviewed, pt had no further questions. 

## 2021-12-15 DIAGNOSIS — D259 Leiomyoma of uterus, unspecified: Secondary | ICD-10-CM | POA: Diagnosis not present

## 2022-01-06 DIAGNOSIS — R7309 Other abnormal glucose: Secondary | ICD-10-CM | POA: Diagnosis not present

## 2022-01-13 DIAGNOSIS — Z713 Dietary counseling and surveillance: Secondary | ICD-10-CM | POA: Diagnosis not present

## 2022-01-22 DIAGNOSIS — Z713 Dietary counseling and surveillance: Secondary | ICD-10-CM | POA: Diagnosis not present

## 2022-02-04 DIAGNOSIS — Z713 Dietary counseling and surveillance: Secondary | ICD-10-CM | POA: Diagnosis not present

## 2022-02-11 DIAGNOSIS — R7309 Other abnormal glucose: Secondary | ICD-10-CM | POA: Diagnosis not present

## 2023-12-11 IMAGING — CT CT ABD-PELV W/ CM
2 of 5 series · 16 of 46 positions shown, 18 images · IV contrast (APPLIED)
Comparison: None.

CLINICAL DATA: 41-year-old female with acute abdominal and pelvic
pain.

EXAM:
CT ABDOMEN AND PELVIS WITH CONTRAST
TECHNIQUE: Multidetector CT imaging of the abdomen and pelvis was performed
using the standard protocol following bolus administration of
intravenous contrast.

[Series 4: abdomen 3.0 mpr cor · coronal · 0.84mm/px · 3 of 91 slices shown]
[im 31/91  soft-tissue]
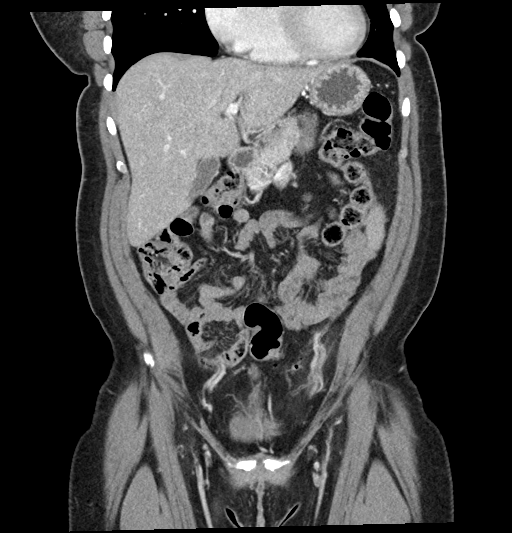
[im 41/91  soft-tissue]
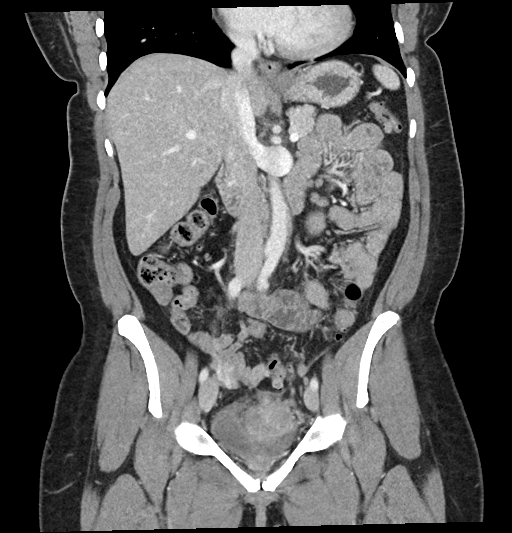
[im 51/91  soft-tissue]
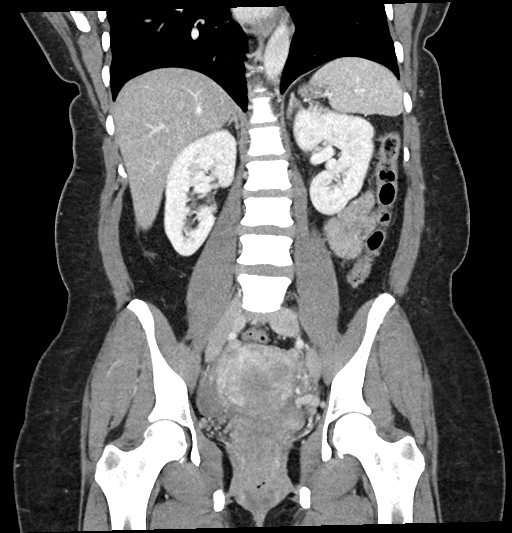

[Series 5: abdomen 5.0 · axial · 0.74mm/px · z∈[+804,+1184]mm · 13 of 88 slices shown, 15 images]
[im 6/88  soft-tissue]
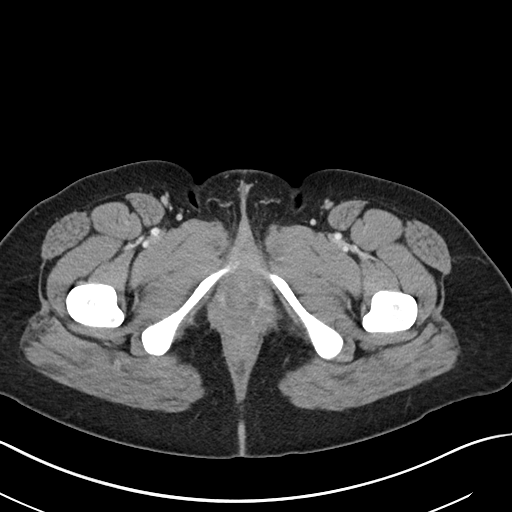
[im 6/88  bone]
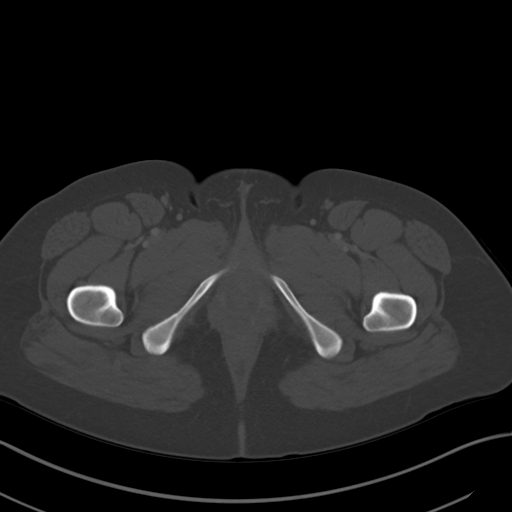
[im 12/88  soft-tissue]
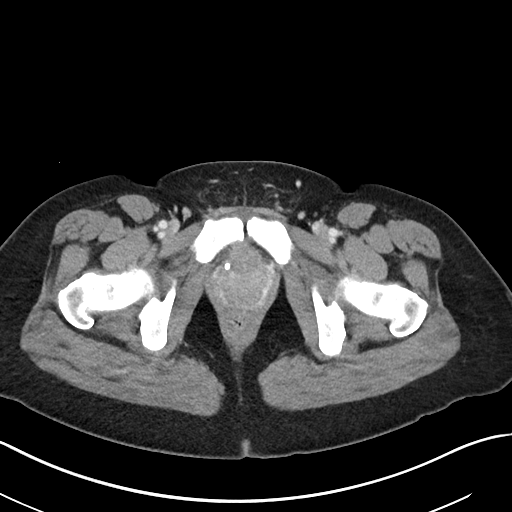
[im 18/88  soft-tissue]
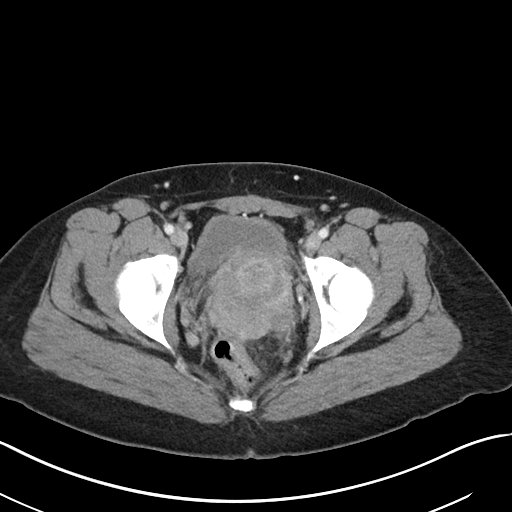
[im 24/88  soft-tissue]
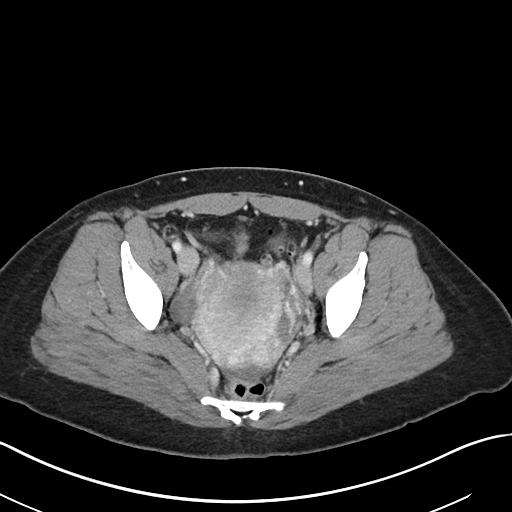
[im 30/88  soft-tissue]
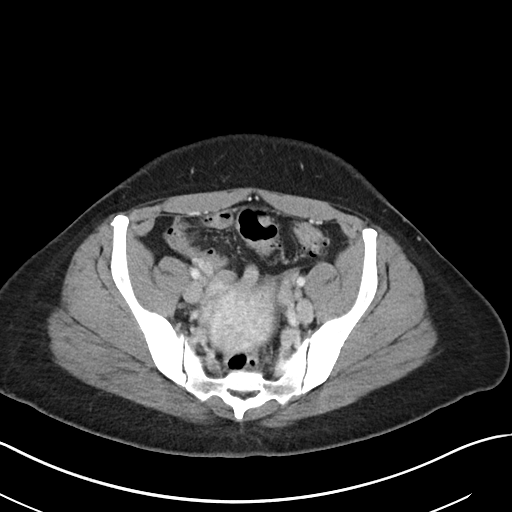
[im 35/88  soft-tissue]
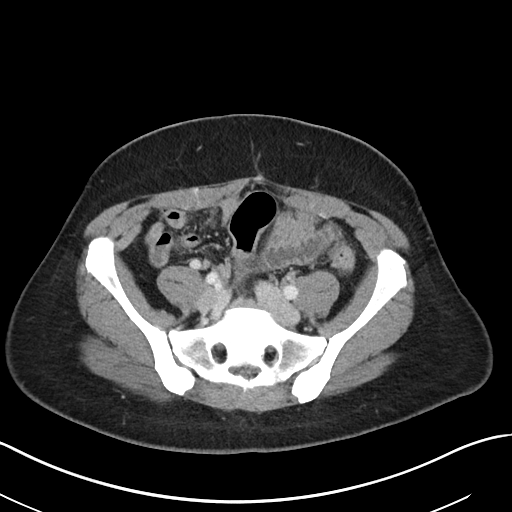
[im 47/88  soft-tissue]
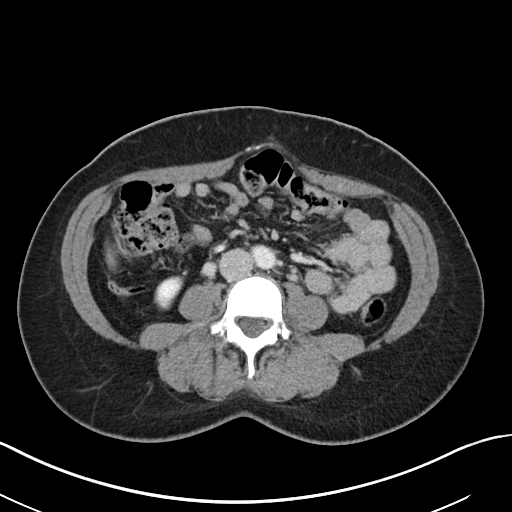
[im 53/88  soft-tissue]
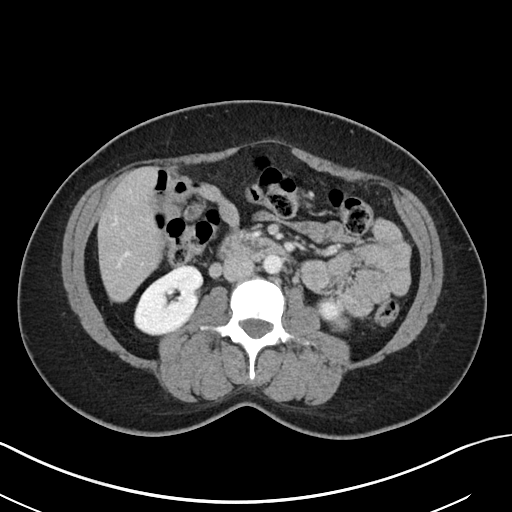
[im 59/88  soft-tissue]
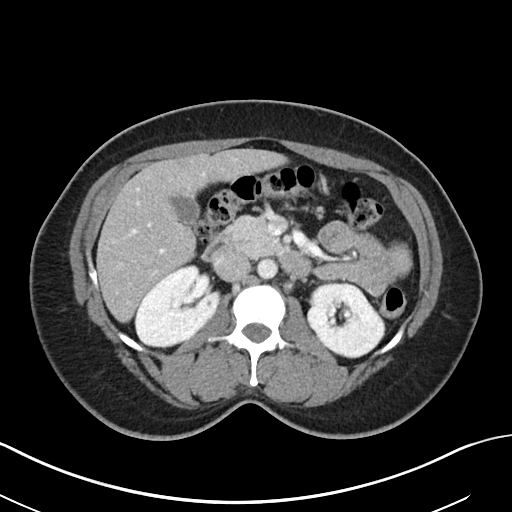
[im 59/88  bone]
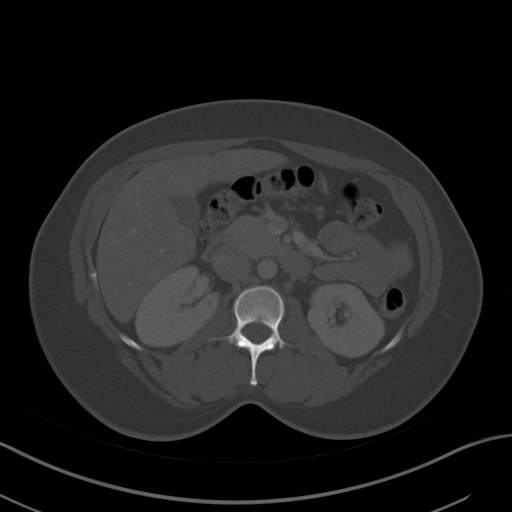
[im 64/88  soft-tissue]
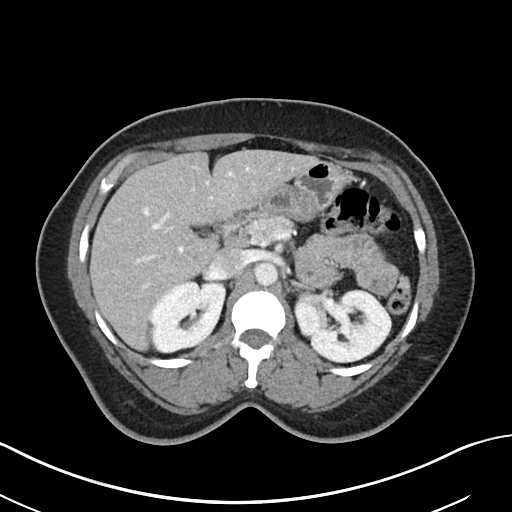
[im 70/88  soft-tissue]
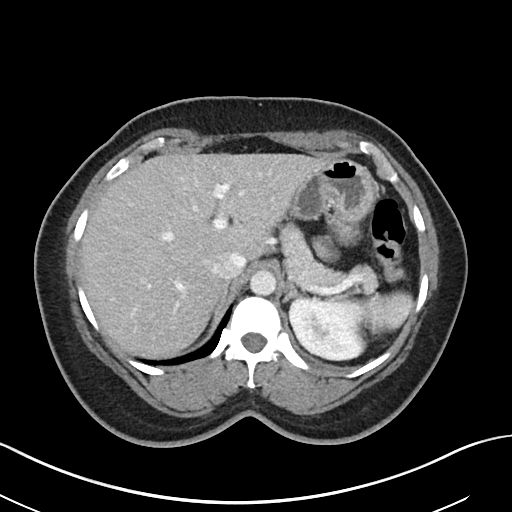
[im 76/88  soft-tissue]
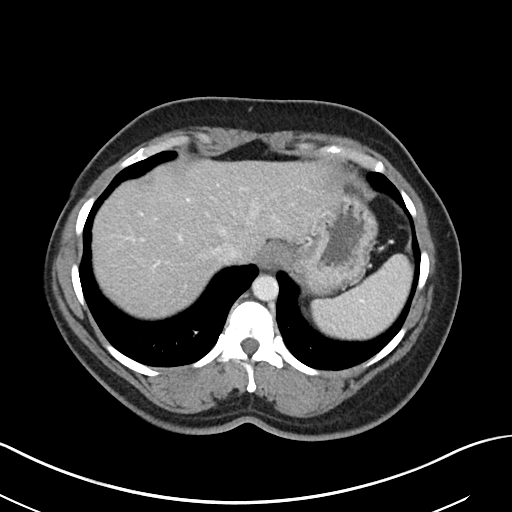
[im 82/88  soft-tissue]
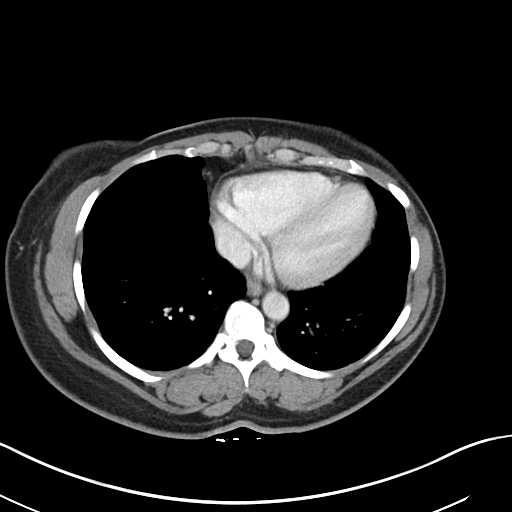

[16 of 46 positions shown; findings below may reference images not displayed]

RADIATION DOSE REDUCTION: This exam was performed according to the
departmental dose-optimization program which includes automated
exposure control, adjustment of the mA and/or kV according to
patient size and/or use of iterative reconstruction technique.

CONTRAST:  100mL OMNIPAQUE IOHEXOL 300 MG/ML  SOLN
FINDINGS: Lower chest: No acute abnormality.

Hepatobiliary: The liver and gallbladder are unremarkable. There is
no evidence of intrahepatic or extrahepatic biliary dilatation.

Pancreas: Unremarkable

Spleen: Unremarkable

Adrenals/Urinary Tract: The kidneys, adrenal glands and bladder are
unremarkable.

Stomach/Bowel: Stomach is within normal limits. Appendix appears
normal. No evidence of bowel wall thickening, distention, or
inflammatory changes. Colonic diverticulosis identified without
evidence of acute diverticulitis.

Vascular/Lymphatic: No significant vascular findings are present. No
enlarged abdominal or pelvic lymph nodes.

Reproductive: Enlarged retroverted heterogeneous uterus containing
discrete masses/fibroids noted. No adnexal abnormalities are noted.

Other: A small amount of free pelvic fluid is nonspecific.

Musculoskeletal: No acute or suspicious bony abnormalities are
noted.
IMPRESSION: 1. Small amount of free pelvic fluid, nonspecific, but may be
physiologic.
2. Enlarged heterogeneous fibroid uterus.
3. Colonic diverticulosis without evidence of acute diverticulitis.

## 2024-08-28 ENCOUNTER — Ambulatory Visit
Admission: RE | Admit: 2024-08-28 | Discharge: 2024-08-28 | Disposition: A | Source: Ambulatory Visit | Attending: Family Medicine

## 2024-08-28 ENCOUNTER — Other Ambulatory Visit: Payer: Self-pay | Admitting: Family Medicine

## 2024-08-28 ENCOUNTER — Encounter: Payer: Self-pay | Admitting: Family Medicine

## 2024-08-28 DIAGNOSIS — R109 Unspecified abdominal pain: Secondary | ICD-10-CM

## 2024-08-28 MED ORDER — IOPAMIDOL (ISOVUE-300) INJECTION 61%
100.0000 mL | Freq: Once | INTRAVENOUS | Status: AC | PRN
  Administered 2024-08-28: 100 mL via INTRAVENOUS
